# Patient Record
Sex: Female | Born: 1965 | Race: Black or African American | Hispanic: No
Health system: Southern US, Academic
[De-identification: ages and names within clinical notes are randomized; demographics above are authoritative.]

## PROBLEM LIST (undated history)

## (undated) ENCOUNTER — Ambulatory Visit: Payer: BLUE CROSS/BLUE SHIELD

## (undated) ENCOUNTER — Telehealth

## (undated) ENCOUNTER — Ambulatory Visit: Payer: PRIVATE HEALTH INSURANCE

## (undated) ENCOUNTER — Encounter

## (undated) ENCOUNTER — Other Ambulatory Visit

## (undated) ENCOUNTER — Encounter: Attending: Family | Primary: Family

## (undated) ENCOUNTER — Ambulatory Visit

## (undated) ENCOUNTER — Encounter
Attending: Student in an Organized Health Care Education/Training Program | Primary: Student in an Organized Health Care Education/Training Program

## (undated) ENCOUNTER — Ambulatory Visit
Attending: Student in an Organized Health Care Education/Training Program | Primary: Student in an Organized Health Care Education/Training Program

## (undated) ENCOUNTER — Ambulatory Visit: Payer: BLUE CROSS/BLUE SHIELD | Attending: Family | Primary: Family

## (undated) ENCOUNTER — Telehealth: Attending: Surgical Oncology | Primary: Surgical Oncology

## (undated) ENCOUNTER — Encounter: Attending: Infectious Disease | Primary: Infectious Disease

## (undated) DIAGNOSIS — E119 Type 2 diabetes mellitus without complications: Secondary | ICD-10-CM

## (undated) DIAGNOSIS — E785 Hyperlipidemia, unspecified: Secondary | ICD-10-CM

## (undated) DIAGNOSIS — B20 Human immunodeficiency virus [HIV] disease: Secondary | ICD-10-CM

## (undated) DIAGNOSIS — I10 Essential (primary) hypertension: Secondary | ICD-10-CM

## (undated) DIAGNOSIS — Z21 Asymptomatic human immunodeficiency virus [HIV] infection status: Secondary | ICD-10-CM

## (undated) HISTORY — DX: Hyperlipidemia, unspecified: E78.5

## (undated) HISTORY — PX: TONSILLECTOMY: SUR1361

## (undated) HISTORY — PX: PARTIAL HYSTERECTOMY: SHX80

---

## 2004-10-27 ENCOUNTER — Inpatient Hospital Stay: Payer: Self-pay | Admitting: Obstetrics and Gynecology

## 2004-11-22 ENCOUNTER — Inpatient Hospital Stay: Payer: Self-pay | Admitting: Obstetrics and Gynecology

## 2006-02-14 ENCOUNTER — Ambulatory Visit: Payer: Self-pay | Admitting: Obstetrics and Gynecology

## 2008-02-26 ENCOUNTER — Ambulatory Visit: Payer: Self-pay | Admitting: Obstetrics and Gynecology

## 2009-08-10 ENCOUNTER — Emergency Department: Payer: Self-pay | Admitting: Emergency Medicine

## 2009-09-03 ENCOUNTER — Emergency Department: Payer: Self-pay | Admitting: Emergency Medicine

## 2009-09-29 ENCOUNTER — Inpatient Hospital Stay: Payer: Self-pay | Admitting: Internal Medicine

## 2011-05-04 ENCOUNTER — Other Ambulatory Visit: Payer: Self-pay | Admitting: Internal Medicine

## 2011-06-23 ENCOUNTER — Ambulatory Visit: Payer: Self-pay | Admitting: Specialist

## 2011-12-16 DIAGNOSIS — I10 Essential (primary) hypertension: Secondary | ICD-10-CM | POA: Insufficient documentation

## 2012-03-16 DIAGNOSIS — R059 Cough, unspecified: Secondary | ICD-10-CM | POA: Insufficient documentation

## 2012-03-16 DIAGNOSIS — E7800 Pure hypercholesterolemia, unspecified: Secondary | ICD-10-CM | POA: Insufficient documentation

## 2012-06-27 ENCOUNTER — Ambulatory Visit: Payer: Self-pay | Admitting: Specialist

## 2012-12-06 ENCOUNTER — Emergency Department: Payer: Self-pay | Admitting: Emergency Medicine

## 2012-12-06 LAB — URINALYSIS, COMPLETE
Bilirubin,UR: NEGATIVE
Blood: NEGATIVE
Glucose,UR: NEGATIVE mg/dL (ref 0–75)
Ketone: NEGATIVE
Leukocyte Esterase: NEGATIVE
Nitrite: NEGATIVE
Ph: 6 (ref 4.5–8.0)
Protein: NEGATIVE
RBC,UR: NONE SEEN /HPF (ref 0–5)
Specific Gravity: 1.003 (ref 1.003–1.030)
Squamous Epithelial: <1
WBC UR: 1 /HPF (ref 0–5)

## 2012-12-06 LAB — CBC
HCT: 36.8 % (ref 35.0–47.0)
HGB: 12.2 g/dL (ref 12.0–16.0)
MCH: 30.3 pg (ref 26.0–34.0)
MCHC: 33.1 g/dL (ref 32.0–36.0)
MCV: 92 fL (ref 80–100)
Platelet: 271 10*3/uL (ref 150–440)
RBC: 4.02 10*6/uL (ref 3.80–5.20)
RDW: 14.4 % (ref 11.5–14.5)
WBC: 6.1 10*3/uL (ref 3.6–11.0)

## 2012-12-06 LAB — COMPREHENSIVE METABOLIC PANEL
Albumin: 3.6 g/dL (ref 3.4–5.0)
Alkaline Phosphatase: 71 U/L (ref 50–136)
Anion Gap: 5 — ABNORMAL LOW (ref 7–16)
BUN: 10 mg/dL (ref 7–18)
Bilirubin,Total: 0.2 mg/dL (ref 0.2–1.0)
Calcium, Total: 8.7 mg/dL (ref 8.5–10.1)
Chloride: 103 mmol/L (ref 98–107)
Co2: 26 mmol/L (ref 21–32)
Creatinine: 0.68 mg/dL (ref 0.60–1.30)
EGFR (African American): >60
EGFR (Non-African Amer.): >60
Glucose: 123 mg/dL — ABNORMAL HIGH (ref 65–99)
Osmolality: 269 (ref 275–301)
Potassium: 3.1 mmol/L — ABNORMAL LOW (ref 3.5–5.1)
SGOT(AST): 19 U/L (ref 15–37)
SGPT (ALT): 15 U/L (ref 12–78)
Sodium: 134 mmol/L — ABNORMAL LOW (ref 136–145)
Total Protein: 7.8 g/dL (ref 6.4–8.2)

## 2012-12-06 LAB — TSH: Thyroid Stimulating Horm: 1.75 u[IU]/mL

## 2012-12-06 LAB — TROPONIN I: Troponin-I: <0.02 ng/mL

## 2014-10-07 DIAGNOSIS — E010 Iodine-deficiency related diffuse (endemic) goiter: Secondary | ICD-10-CM | POA: Insufficient documentation

## 2014-10-10 ENCOUNTER — Ambulatory Visit: Payer: Self-pay

## 2014-12-05 ENCOUNTER — Other Ambulatory Visit: Payer: Self-pay | Admitting: Family Medicine

## 2014-12-05 DIAGNOSIS — Z1231 Encounter for screening mammogram for malignant neoplasm of breast: Secondary | ICD-10-CM

## 2015-01-01 ENCOUNTER — Ambulatory Visit: Payer: Self-pay | Attending: Family Medicine

## 2015-10-23 DIAGNOSIS — B20 Human immunodeficiency virus [HIV] disease: Secondary | ICD-10-CM | POA: Insufficient documentation

## 2015-10-23 DIAGNOSIS — Z21 Asymptomatic human immunodeficiency virus [HIV] infection status: Secondary | ICD-10-CM | POA: Insufficient documentation

## 2016-04-15 ENCOUNTER — Other Ambulatory Visit: Payer: Self-pay | Admitting: Family Medicine

## 2016-04-15 DIAGNOSIS — Z1231 Encounter for screening mammogram for malignant neoplasm of breast: Secondary | ICD-10-CM

## 2016-04-26 ENCOUNTER — Ambulatory Visit: Payer: Self-pay | Attending: Family Medicine

## 2017-04-20 MED ORDER — HYDROCHLOROTHIAZIDE 12.5 MG TABLET
ORAL_TABLET | 3 refills | 0 days
Start: 2017-04-20 — End: 2018-07-11

## 2017-04-21 ENCOUNTER — Other Ambulatory Visit: Payer: Self-pay | Admitting: Family Medicine

## 2017-04-21 DIAGNOSIS — Z1231 Encounter for screening mammogram for malignant neoplasm of breast: Secondary | ICD-10-CM

## 2017-05-04 ENCOUNTER — Encounter: Payer: Self-pay | Admitting: Radiology

## 2017-05-04 ENCOUNTER — Ambulatory Visit
Admission: RE | Admit: 2017-05-04 | Discharge: 2017-05-04 | Disposition: A | Discharge status: Home / Self Care | Payer: Commercial Managed Care - PPO | Source: Ambulatory Visit | Attending: Family Medicine | Admitting: Family Medicine

## 2017-05-04 DIAGNOSIS — Z1231 Encounter for screening mammogram for malignant neoplasm of breast: Secondary | ICD-10-CM

## 2017-05-18 MED ORDER — EFAVIRENZ 600 MG-EMTRICITABINE 200 MG-TENOFOVIR DISOPROX 300 MG TABLET
ORAL_TABLET | ORAL | 99 refills | 0 days
Start: 2017-05-18 — End: 2018-05-18

## 2017-05-19 MED ORDER — EFAVIRENZ 600 MG-EMTRICITABINE 200 MG-TENOFOVIR DISOPROX 300 MG TABLET
ORAL_TABLET | ORAL | 11 refills | 0 days
Start: 2017-05-19 — End: 2018-05-19

## 2017-05-19 MED ORDER — BICTEGRAVIR 50 MG-EMTRICITABINE 200 MG-TENOFOVIR ALAFENAM 25 MG TABLET
ORAL_TABLET | ORAL | 11 refills | 0.00000 days
Start: 2017-05-19 — End: 2017-05-19

## 2017-05-19 MED ORDER — BICTEGRAVIR 50 MG-EMTRICITABINE 200 MG-TENOFOVIR ALAFENAM 25 MG TABLET: 1 | tablet | 11 refills | 0 days

## 2017-05-22 MED FILL — ATRIPLA/600-200-300MG/TABS: ATRIPLA/600-200-300MG/TABS | 30 days supply | Qty: 30 | Fill #0

## 2017-05-23 MED FILL — BIKTARVY/50-200-25MG/TABS: BIKTARVY/50-200-25MG/TABS | 30 days supply | Qty: 30 | Fill #0

## 2017-06-07 MED FILL — HYDROCHLOROTHIAZIDE/12.5MG/TABS: HYDROCHLOROTHIAZIDE/12.5MG/TABS | 90 days supply | Qty: 90 | Fill #0

## 2017-07-19 MED FILL — BIKTARVY/50-200-25MG/TABS: BIKTARVY/50-200-25MG/TABS | 30 days supply | Qty: 30 | Fill #1

## 2017-08-16 MED FILL — BIKTARVY/50-200-25MG/TABS: BIKTARVY/50-200-25MG/TABS | 30 days supply | Qty: 30 | Fill #2

## 2017-08-16 MED FILL — HYDROCHLOROTHIAZIDE/12.5MG/TABS: HYDROCHLOROTHIAZIDE/12.5MG/TABS | 90 days supply | Qty: 90 | Fill #1

## 2017-08-25 DIAGNOSIS — K5909 Other constipation: Secondary | ICD-10-CM | POA: Insufficient documentation

## 2017-09-18 MED FILL — BIKTARVY/50-200-25MG/TABS: BIKTARVY/50-200-25MG/TABS | 30 days supply | Qty: 30 | Fill #3

## 2017-10-16 MED FILL — BIKTARVY/50-200-25MG/TABS: BIKTARVY/50-200-25MG/TABS | 30 days supply | Qty: 30 | Fill #4

## 2017-11-13 MED FILL — BIKTARVY/50-200-25MG/TABS: BIKTARVY/50-200-25MG/TABS | 30 days supply | Qty: 30 | Fill #5

## 2017-12-18 MED FILL — BIKTARVY/50-200-25MG/TABS: BIKTARVY/50-200-25MG/TABS | 30 days supply | Qty: 30 | Fill #6

## 2017-12-18 MED FILL — HYDROCHLOROTHIAZIDE/12.5MG/TABS: HYDROCHLOROTHIAZIDE/12.5MG/TABS | 90 days supply | Qty: 90 | Fill #2

## 2018-01-17 MED FILL — BIKTARVY/50-200-25MG/TABS: BIKTARVY/50-200-25MG/TABS | 30 days supply | Qty: 30 | Fill #7

## 2018-01-22 MED ORDER — BICTEGRAVIR 50 MG-EMTRICITABINE 200 MG-TENOFOVIR ALAFENAM 25 MG TABLET
ORAL_TABLET | ORAL | 11 refills | 0 days
Start: 2018-01-22 — End: 2019-02-08

## 2018-02-12 MED FILL — BIKTARVY/50-200-25MG/TABS: BIKTARVY/50-200-25MG/TABS | 30 days supply | Qty: 30 | Fill #8

## 2018-03-16 MED FILL — BIKTARVY/50-200-25MG/TABS: BIKTARVY/50-200-25MG/TABS | 30 days supply | Qty: 30 | Fill #9

## 2018-04-12 MED FILL — HYDROCHLOROTHIAZIDE 12.5 MG TABLET: 90 days supply | Qty: 90 | Fill #0

## 2018-04-12 MED FILL — BIKTARVY 50 MG-200 MG-25 MG TABLET: ORAL | 30 days supply | Qty: 30 | Fill #0

## 2018-04-12 MED FILL — HYDROCHLOROTHIAZIDE 12.5 MG TABLET: 90 days supply | Qty: 90 | Fill #0 | Status: AC

## 2018-04-12 MED FILL — BIKTARVY 50 MG-200 MG-25 MG TABLET: 30 days supply | Qty: 30 | Fill #0 | Status: AC

## 2018-05-15 MED FILL — BIKTARVY 50 MG-200 MG-25 MG TABLET: 30 days supply | Qty: 30 | Fill #1 | Status: AC

## 2018-05-15 MED FILL — BIKTARVY 50 MG-200 MG-25 MG TABLET: ORAL | 30 days supply | Qty: 30 | Fill #1

## 2018-06-12 MED FILL — BIKTARVY 50 MG-200 MG-25 MG TABLET: ORAL | 30 days supply | Qty: 30 | Fill #0

## 2018-06-12 MED FILL — BIKTARVY 50 MG-200 MG-25 MG TABLET: 30 days supply | Qty: 30 | Fill #0 | Status: AC

## 2018-07-12 MED FILL — BIKTARVY 50 MG-200 MG-25 MG TABLET: ORAL | 30 days supply | Qty: 30 | Fill #1

## 2018-07-12 MED FILL — BIKTARVY 50 MG-200 MG-25 MG TABLET: 30 days supply | Qty: 30 | Fill #1 | Status: AC

## 2018-08-08 ENCOUNTER — Other Ambulatory Visit: Payer: Self-pay | Admitting: Internal Medicine

## 2018-08-08 DIAGNOSIS — Z1231 Encounter for screening mammogram for malignant neoplasm of breast: Secondary | ICD-10-CM

## 2018-08-10 MED FILL — BIKTARVY 50 MG-200 MG-25 MG TABLET: ORAL | 30 days supply | Qty: 30 | Fill #2

## 2018-08-10 MED FILL — BIKTARVY 50 MG-200 MG-25 MG TABLET: 30 days supply | Qty: 30 | Fill #2 | Status: AC

## 2018-09-03 ENCOUNTER — Ambulatory Visit
Admission: RE | Admit: 2018-09-03 | Discharge: 2018-09-03 | Disposition: A | Discharge status: Home / Self Care | Payer: Commercial Managed Care - PPO | Source: Ambulatory Visit | Attending: Internal Medicine | Admitting: Internal Medicine

## 2018-09-03 DIAGNOSIS — Z1231 Encounter for screening mammogram for malignant neoplasm of breast: Secondary | ICD-10-CM | POA: Diagnosis present

## 2018-09-03 IMAGING — MG DIGITAL SCREENING BILATERAL MAMMOGRAM WITH TOMO AND CAD
8 series · 8 of 24 positions shown · non-contrast
Comparison: Previous exam(s).

CLINICAL DATA: Screening.

EXAM:
DIGITAL SCREENING BILATERAL MAMMOGRAM WITH TOMO AND CAD

[R MLO synth-2D]
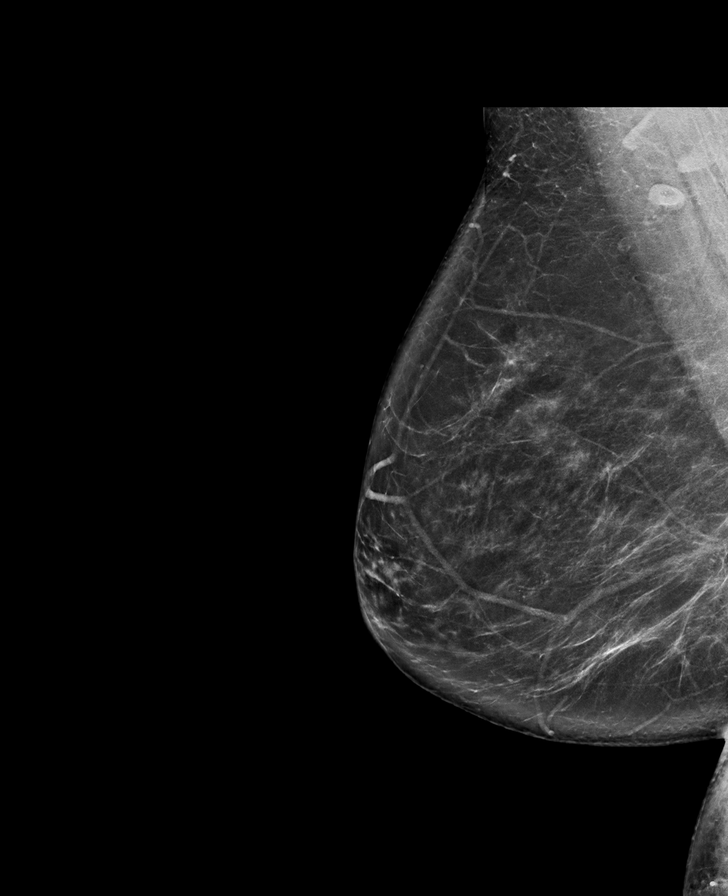

[L CC synth-2D]
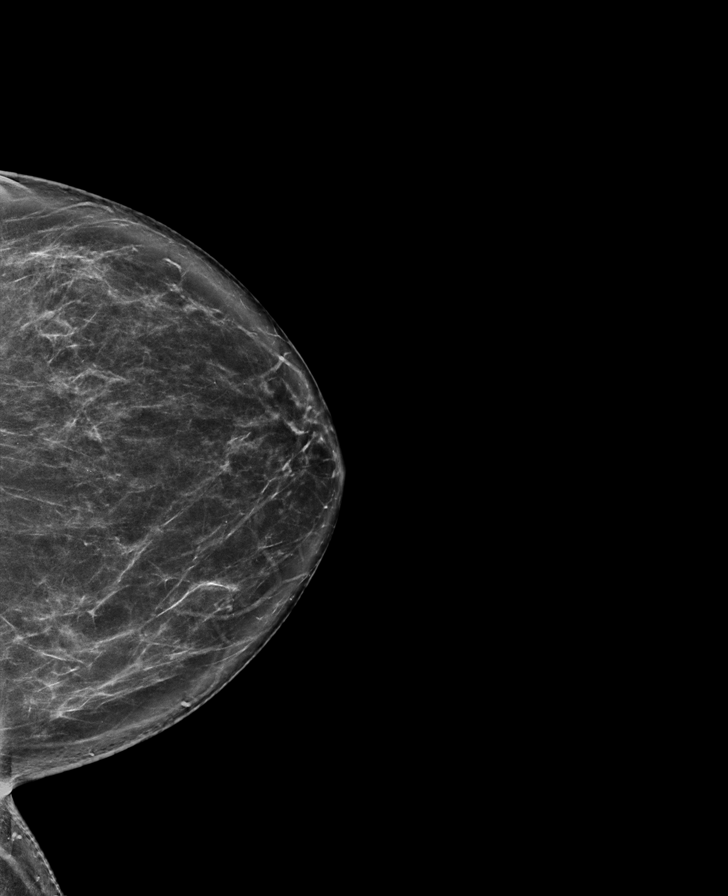

[R CC synth-2D]
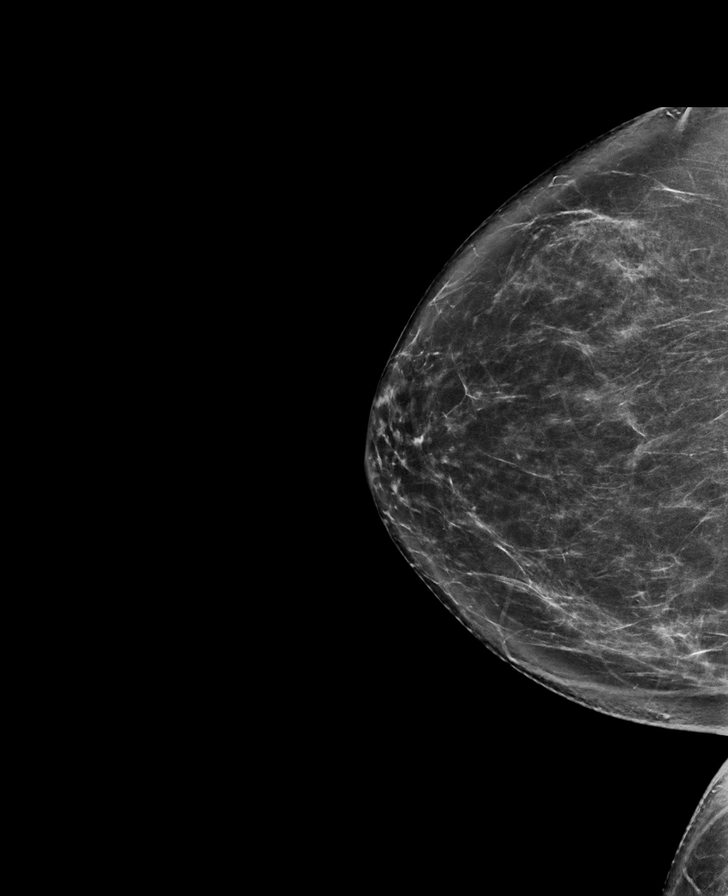

[L MLO synth-2D]
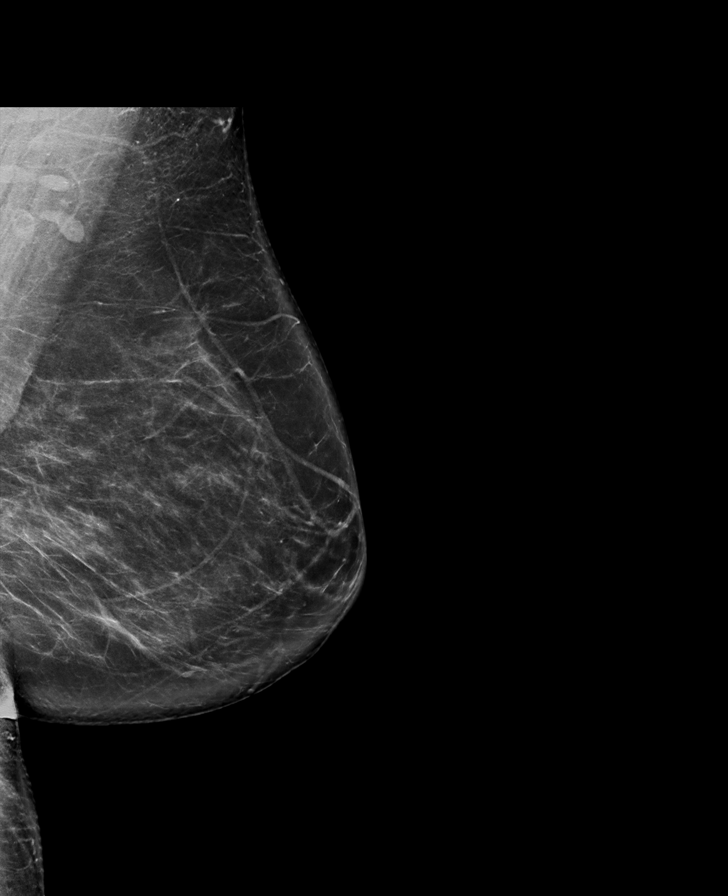

[L MLO tomo · tomo slice 45/90.0]
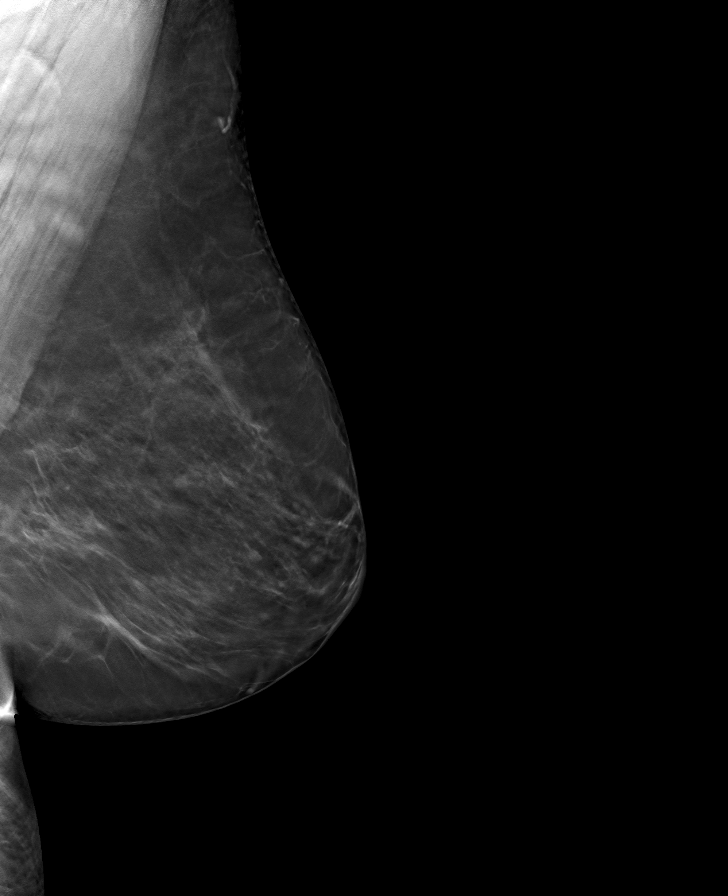

[L CC tomo · tomo slice 41/80.0]
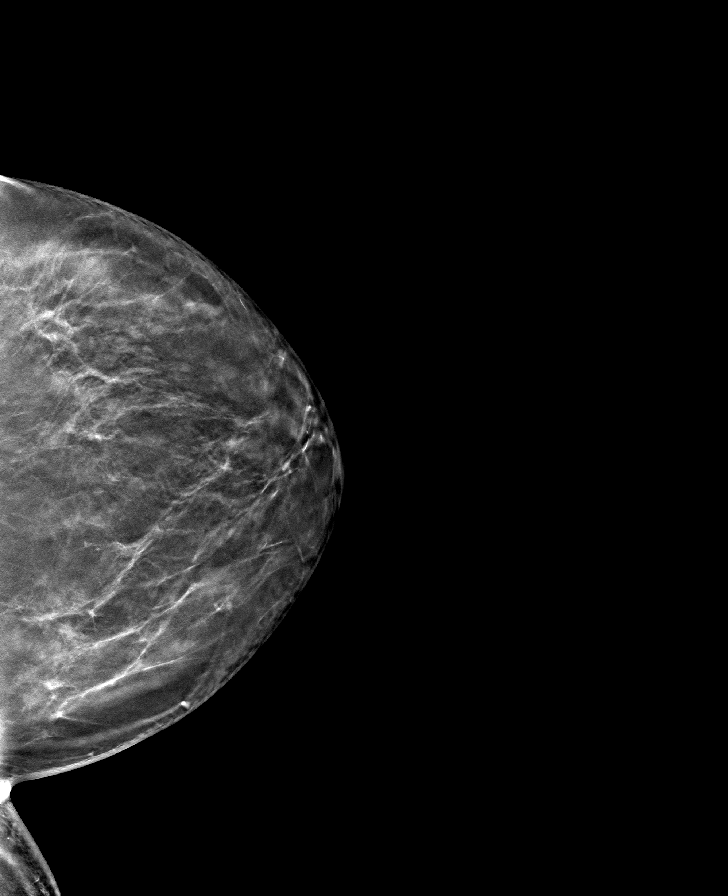

[R CC tomo · tomo slice 41/81.0]
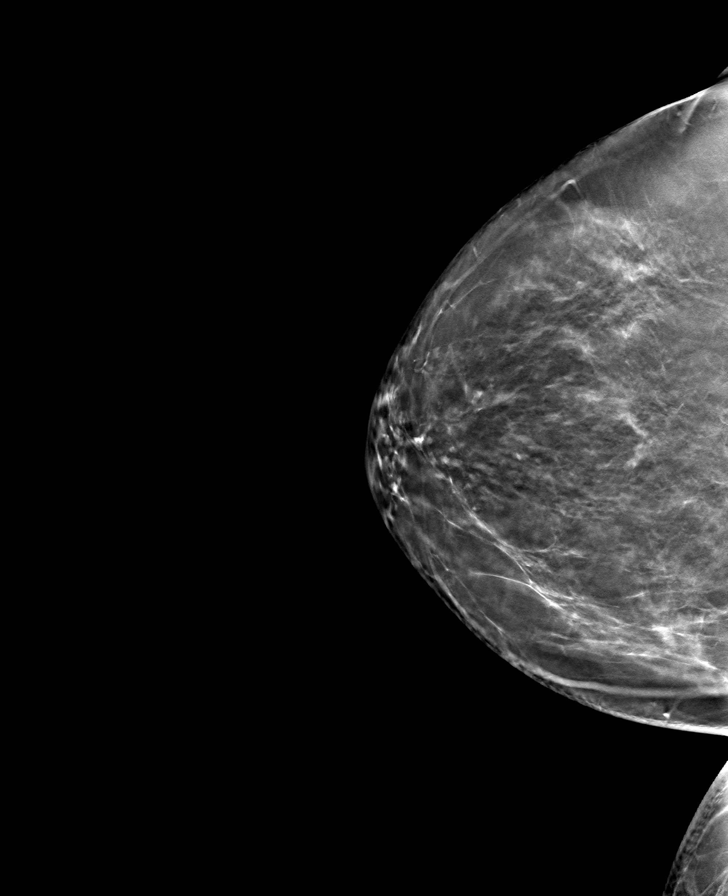

[R MLO tomo · tomo slice 46/91.0]
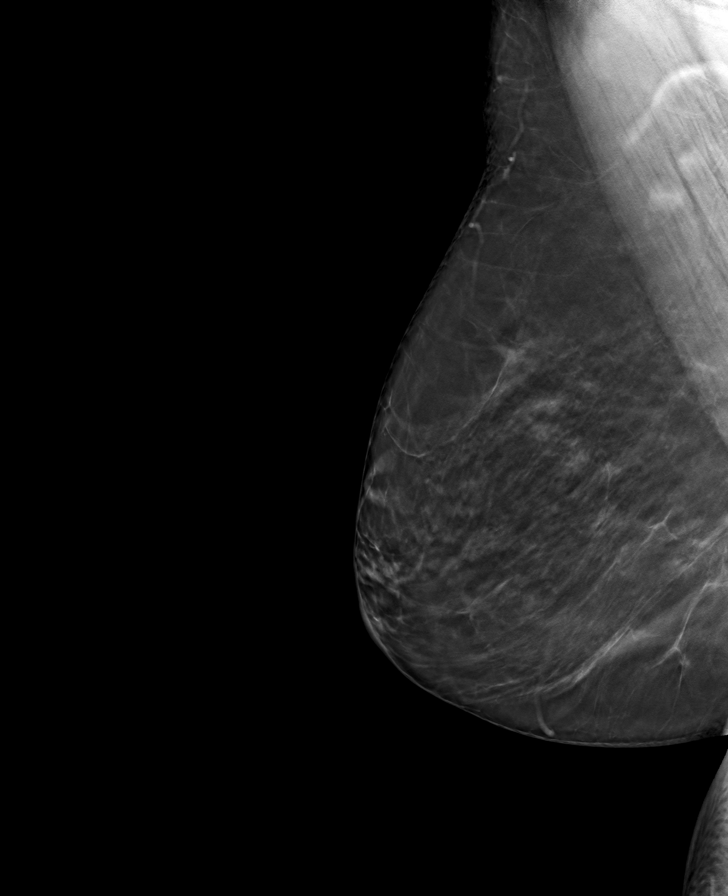

[8 of 24 positions shown; findings below may reference images not displayed]

ACR Breast Density Category b: There are scattered areas of
fibroglandular density.
FINDINGS: There are no findings suspicious for malignancy. Images were
processed with CAD.
IMPRESSION: No mammographic evidence of malignancy. A result letter of this
screening mammogram will be mailed directly to the patient.

RECOMMENDATION:
Screening mammogram in one year. (Code:[TQ])

BI-RADS CATEGORY  1: Negative.

## 2018-09-12 MED FILL — BIKTARVY 50 MG-200 MG-25 MG TABLET: ORAL | 30 days supply | Qty: 30 | Fill #3

## 2018-09-12 MED FILL — BIKTARVY 50 MG-200 MG-25 MG TABLET: 30 days supply | Qty: 30 | Fill #3 | Status: AC

## 2018-10-11 MED FILL — BIKTARVY 50 MG-200 MG-25 MG TABLET: 30 days supply | Qty: 30 | Fill #4 | Status: AC

## 2018-10-11 MED FILL — BIKTARVY 50 MG-200 MG-25 MG TABLET: ORAL | 30 days supply | Qty: 30 | Fill #4

## 2018-10-17 ENCOUNTER — Other Ambulatory Visit: Payer: Self-pay

## 2018-10-17 DIAGNOSIS — Z1211 Encounter for screening for malignant neoplasm of colon: Secondary | ICD-10-CM

## 2018-10-17 MED ORDER — NA SULFATE-K SULFATE-MG SULF 17.5-3.13-1.6 GM/177ML PO SOLN: 1.0000 | Freq: Once | ORAL | 0 refills | Status: AC

## 2018-10-18 ENCOUNTER — Other Ambulatory Visit: Payer: Self-pay

## 2018-10-18 ENCOUNTER — Encounter: Payer: Self-pay | Admitting: *Deleted

## 2018-10-19 ENCOUNTER — Encounter: Payer: Self-pay | Admitting: Anesthesiology

## 2018-10-26 NOTE — Anesthesia Preprocedure Evaluation (Deleted)
Anesthesia Evaluation    Airway        Dental   Pulmonary former smoker (quit 1995),           Cardiovascular hypertension,      Neuro/Psych    GI/Hepatic   Endo/Other    Renal/GU      Musculoskeletal   Abdominal   Peds  Hematology HIV on HAART   Anesthesia Other Findings   Reproductive/Obstetrics                             Anesthesia Physical Anesthesia Plan  ASA: II  Anesthesia Plan: General   Post-op Pain Management:    Induction: Intravenous  PONV Risk Score and Plan: 3 and Propofol infusion and TIVA  Airway Management Planned: Natural Airway  Additional Equipment:   Intra-op Plan:   Post-operative Plan:   Informed Consent:   Plan Discussed with:   Anesthesia Plan Comments:         Anesthesia Quick Evaluation

## 2018-10-26 NOTE — Discharge Instructions (Signed)
General Anesthesia, Adult, Care After  This sheet gives you information about how to care for yourself after your procedure. Your health care provider may also give you more specific instructions. If you have problems or questions, contact your health care provider.  What can I expect after the procedure?  After the procedure, the following side effects are common:  Pain or discomfort at the IV site.  Nausea.  Vomiting.  Sore throat.  Trouble concentrating.  Feeling cold or chills.  Weak or tired.  Sleepiness and fatigue.  Soreness and body aches. These side effects can affect parts of the body that were not involved in surgery.  Follow these instructions at home:    For at least 24 hours after the procedure:  Have a responsible adult stay with you. It is important to have someone help care for you until you are awake and alert.  Rest as needed.  Do not:  Participate in activities in which you could fall or become injured.  Drive.  Use heavy machinery.  Drink alcohol.  Take sleeping pills or medicines that cause drowsiness.  Make important decisions or sign legal documents.  Take care of children on your own.  Eating and drinking  Follow any instructions from your health care provider about eating or drinking restrictions.  When you feel hungry, start by eating small amounts of foods that are soft and easy to digest (bland), such as toast. Gradually return to your regular diet.  Drink enough fluid to keep your urine pale yellow.  If you vomit, rehydrate by drinking water, juice, or clear broth.  General instructions  If you have sleep apnea, surgery and certain medicines can increase your risk for breathing problems. Follow instructions from your health care provider about wearing your sleep device:  Anytime you are sleeping, including during daytime naps.  While taking prescription pain medicines, sleeping medicines, or medicines that make you drowsy.  Return to your normal activities as told by your health care  provider. Ask your health care provider what activities are safe for you.  Take over-the-counter and prescription medicines only as told by your health care provider.  If you smoke, do not smoke without supervision.  Keep all follow-up visits as told by your health care provider. This is important.  Contact a health care provider if:  You have nausea or vomiting that does not get better with medicine.  You cannot eat or drink without vomiting.  You have pain that does not get better with medicine.  You are unable to pass urine.  You develop a skin rash.  You have a fever.  You have redness around your IV site that gets worse.  Get help right away if:  You have difficulty breathing.  You have chest pain.  You have blood in your urine or stool, or you vomit blood.  Summary  After the procedure, it is common to have a sore throat or nausea. It is also common to feel tired.  Have a responsible adult stay with you for the first 24 hours after general anesthesia. It is important to have someone help care for you until you are awake and alert.  When you feel hungry, start by eating small amounts of foods that are soft and easy to digest (bland), such as toast. Gradually return to your regular diet.  Drink enough fluid to keep your urine pale yellow.  Return to your normal activities as told by your health care provider. Ask your health care   provider what activities are safe for you.  This information is not intended to replace advice given to you by your health care provider. Make sure you discuss any questions you have with your health care provider.  Document Released: 11/14/2000 Document Revised: 03/24/2017 Document Reviewed: 03/24/2017  Elsevier Interactive Patient Education  2019 Elsevier Inc[employer]

## 2018-11-07 MED FILL — BIKTARVY 50 MG-200 MG-25 MG TABLET: ORAL | 30 days supply | Qty: 30 | Fill #5

## 2018-11-07 MED FILL — BIKTARVY 50 MG-200 MG-25 MG TABLET: 30 days supply | Qty: 30 | Fill #5 | Status: AC

## 2018-12-10 ENCOUNTER — Ambulatory Visit
Admission: RE | Admit: 2018-12-10 | Payer: Commercial Managed Care - PPO | Source: Ambulatory Visit | Admitting: Gastroenterology

## 2018-12-10 HISTORY — DX: Human immunodeficiency virus (HIV) disease: B20

## 2018-12-10 HISTORY — DX: Essential (primary) hypertension: I10

## 2018-12-10 HISTORY — DX: Asymptomatic human immunodeficiency virus (hiv) infection status: Z21

## 2018-12-10 SURGERY — COLONOSCOPY WITH PROPOFOL
Anesthesia: General

## 2018-12-12 MED FILL — BIKTARVY 50 MG-200 MG-25 MG TABLET: 30 days supply | Qty: 30 | Fill #6 | Status: AC

## 2018-12-12 MED FILL — BIKTARVY 50 MG-200 MG-25 MG TABLET: ORAL | 30 days supply | Qty: 30 | Fill #6

## 2019-01-08 MED FILL — BIKTARVY 50 MG-200 MG-25 MG TABLET: 30 days supply | Qty: 30 | Fill #7 | Status: AC

## 2019-01-08 MED FILL — BIKTARVY 50 MG-200 MG-25 MG TABLET: ORAL | 30 days supply | Qty: 30 | Fill #7

## 2019-02-07 MED ORDER — BIKTARVY 50 MG-200 MG-25 MG TABLET
ORAL_TABLET | Freq: Every day | ORAL | 3 refills | 30.00000 days
Start: 2019-02-07 — End: ?

## 2019-02-11 MED FILL — BIKTARVY 50 MG-200 MG-25 MG TABLET: ORAL | 30 days supply | Qty: 30 | Fill #0

## 2019-02-11 MED FILL — BIKTARVY 50 MG-200 MG-25 MG TABLET: 30 days supply | Qty: 30 | Fill #0 | Status: AC

## 2019-03-11 MED FILL — BIKTARVY 50 MG-200 MG-25 MG TABLET: ORAL | 30 days supply | Qty: 30 | Fill #1

## 2019-03-11 MED FILL — BIKTARVY 50 MG-200 MG-25 MG TABLET: 30 days supply | Qty: 30 | Fill #1 | Status: AC

## 2019-04-04 MED ORDER — BIKTARVY 50 MG-200 MG-25 MG TABLET
ORAL_TABLET | Freq: Every day | ORAL | 5 refills | 30 days
Start: 2019-04-04 — End: ?

## 2019-04-10 MED FILL — BIKTARVY 50 MG-200 MG-25 MG TABLET: ORAL | 30 days supply | Qty: 30 | Fill #0

## 2019-04-10 MED FILL — BIKTARVY 50 MG-200 MG-25 MG TABLET: 30 days supply | Qty: 30 | Fill #0 | Status: AC

## 2019-05-09 MED FILL — BIKTARVY 50 MG-200 MG-25 MG TABLET: 30 days supply | Qty: 30 | Fill #1 | Status: AC

## 2019-05-09 MED FILL — BIKTARVY 50 MG-200 MG-25 MG TABLET: ORAL | 30 days supply | Qty: 30 | Fill #1

## 2019-06-07 MED FILL — BIKTARVY 50 MG-200 MG-25 MG TABLET: ORAL | 30 days supply | Qty: 30 | Fill #2

## 2019-06-07 MED FILL — BIKTARVY 50 MG-200 MG-25 MG TABLET: 30 days supply | Qty: 30 | Fill #2 | Status: AC

## 2019-07-10 MED FILL — BIKTARVY 50 MG-200 MG-25 MG TABLET: ORAL | 30 days supply | Qty: 30 | Fill #3

## 2019-07-10 MED FILL — BIKTARVY 50 MG-200 MG-25 MG TABLET: 30 days supply | Qty: 30 | Fill #3 | Status: AC

## 2019-08-08 MED FILL — BIKTARVY 50 MG-200 MG-25 MG TABLET: ORAL | 30 days supply | Qty: 30 | Fill #4

## 2019-08-08 MED FILL — BIKTARVY 50 MG-200 MG-25 MG TABLET: 30 days supply | Qty: 30 | Fill #4 | Status: AC

## 2019-09-10 MED FILL — BIKTARVY 50 MG-200 MG-25 MG TABLET: 30 days supply | Qty: 30 | Fill #5 | Status: AC

## 2019-09-10 MED FILL — BIKTARVY 50 MG-200 MG-25 MG TABLET: ORAL | 30 days supply | Qty: 30 | Fill #5

## 2019-10-15 MED ORDER — BIKTARVY 50 MG-200 MG-25 MG TABLET
ORAL_TABLET | Freq: Every day | ORAL | 5 refills | 30.00000 days
Start: 2019-10-15 — End: ?

## 2019-10-15 MED FILL — BIKTARVY 50 MG-200 MG-25 MG TABLET: ORAL | 30 days supply | Qty: 30 | Fill #2

## 2019-10-15 MED FILL — BIKTARVY 50 MG-200 MG-25 MG TABLET: 30 days supply | Qty: 30 | Fill #2 | Status: AC

## 2019-10-16 MED ORDER — BIKTARVY 50 MG-200 MG-25 MG TABLET: 1 | tablet | Freq: Every day | 5 refills | 30 days

## 2019-11-11 MED FILL — BIKTARVY 50 MG-200 MG-25 MG TABLET: 30 days supply | Qty: 30 | Fill #3 | Status: AC

## 2019-11-11 MED FILL — BIKTARVY 50 MG-200 MG-25 MG TABLET: ORAL | 30 days supply | Qty: 30 | Fill #3

## 2019-11-21 MED ORDER — BIKTARVY 50 MG-200 MG-25 MG TABLET
ORAL_TABLET | Freq: Every day | ORAL | 11 refills | 30 days
Start: 2019-11-21 — End: ?

## 2019-12-11 MED FILL — BIKTARVY 50 MG-200 MG-25 MG TABLET: ORAL | 30 days supply | Qty: 30 | Fill #0

## 2019-12-11 MED FILL — BIKTARVY 50 MG-200 MG-25 MG TABLET: 30 days supply | Qty: 30 | Fill #0 | Status: AC

## 2020-01-08 MED FILL — BIKTARVY 50 MG-200 MG-25 MG TABLET: ORAL | 30 days supply | Qty: 30 | Fill #1

## 2020-01-08 MED FILL — BIKTARVY 50 MG-200 MG-25 MG TABLET: 30 days supply | Qty: 30 | Fill #1 | Status: AC

## 2020-02-11 MED FILL — BIKTARVY 50 MG-200 MG-25 MG TABLET: 30 days supply | Qty: 30 | Fill #2 | Status: AC

## 2020-02-11 MED FILL — BIKTARVY 50 MG-200 MG-25 MG TABLET: ORAL | 30 days supply | Qty: 30 | Fill #2

## 2020-03-10 MED FILL — BIKTARVY 50 MG-200 MG-25 MG TABLET: 30 days supply | Qty: 30 | Fill #3 | Status: AC

## 2020-03-10 MED FILL — BIKTARVY 50 MG-200 MG-25 MG TABLET: ORAL | 30 days supply | Qty: 30 | Fill #3

## 2020-04-13 MED FILL — BIKTARVY 50 MG-200 MG-25 MG TABLET: ORAL | 30 days supply | Qty: 30 | Fill #4

## 2020-04-13 MED FILL — BIKTARVY 50 MG-200 MG-25 MG TABLET: 30 days supply | Qty: 30 | Fill #4 | Status: AC

## 2020-04-24 ENCOUNTER — Other Ambulatory Visit: Payer: Self-pay | Admitting: Registered Nurse

## 2020-04-24 DIAGNOSIS — Z1231 Encounter for screening mammogram for malignant neoplasm of breast: Secondary | ICD-10-CM

## 2020-05-05 ENCOUNTER — Other Ambulatory Visit: Payer: Self-pay

## 2020-05-05 ENCOUNTER — Other Ambulatory Visit: Payer: Self-pay | Admitting: Gastroenterology

## 2020-05-05 ENCOUNTER — Telehealth (INDEPENDENT_AMBULATORY_CARE_PROVIDER_SITE_OTHER): Payer: Self-pay | Admitting: Gastroenterology

## 2020-05-05 DIAGNOSIS — Z1211 Encounter for screening for malignant neoplasm of colon: Secondary | ICD-10-CM

## 2020-05-05 MED ORDER — PEG 3350-KCL-NA BICARB-NACL 420 G PO SOLR: 4000.0000 mL | Freq: Once | ORAL | 0 refills | Status: AC

## 2020-05-05 NOTE — Progress Notes (Signed)
Gastroenterology Pre-Procedure Review  Request Date: Friday 05/29/20 Requesting Physician: Dr. Servando Snare  PATIENT REVIEW QUESTIONS: The patient responded to the following health history questions as indicated:    1. Are you having any GI issues? no 2. Do you have a personal history of Polyps? no 3. Do you have a family history of Colon Cancer or Polyps? no 4. Diabetes Mellitus? no 5. Joint replacements in the past 12 months?no 6. Major health problems in the past 3 months?no 7. Any artificial heart valves, MVP, or defibrillator?no    MEDICATIONS & ALLERGIES:    Patient reports the following regarding taking any anticoagulation/antiplatelet therapy:   Plavix, Coumadin, Eliquis, Xarelto, Lovenox, Pradaxa, Brilinta, or Effient? no Aspirin? no  Patient confirms/reports the following medications:  Current Outpatient Medications  Medication Sig Dispense Refill  . bictegravir-emtricitabine-tenofovir AF (BIKTARVY) 50-200-25 MG TABS tablet Take by mouth daily.    Marland Kitchen CALTRATE 600+D3 SOFT 600-800 MG-UNIT CHEW Chew 1 tablet by mouth daily.    . hydrochlorothiazide (MICROZIDE) 12.5 MG capsule Take 12.5 mg by mouth daily.    . metFORMIN (GLUCOPHAGE) 500 MG tablet Take 500 mg by mouth daily.    . Multiple Vitamin (MULTIVITAMIN ADULT PO) Take by mouth.    . polyethylene glycol-electrolytes (NULYTELY) 420 g solution Take 4,000 mLs by mouth once for 1 dose. 4000 mL 0   No current facility-administered medications for this visit.    Patient confirms/reports the following allergies:  No Known Allergies  No orders of the defined types were placed in this encounter.   AUTHORIZATION INFORMATION Primary Insurance: 1D#: Group #:  Secondary Insurance: 1D#: Group #:  SCHEDULE INFORMATION: Date: Friday 05/29/20 Time: Location:[facility]

## 2020-05-07 ENCOUNTER — Other Ambulatory Visit: Payer: Self-pay

## 2020-05-07 MED ORDER — PEG 3350-KCL-NA BICARB-NACL 420 G PO SOLR: 4000.0000 mL | Freq: Once | ORAL | 0 refills | Status: AC

## 2020-05-11 MED FILL — BIKTARVY 50 MG-200 MG-25 MG TABLET: 30 days supply | Qty: 30 | Fill #5 | Status: AC

## 2020-05-11 MED FILL — BIKTARVY 50 MG-200 MG-25 MG TABLET: ORAL | 30 days supply | Qty: 30 | Fill #5

## 2020-05-20 ENCOUNTER — Encounter: Payer: Self-pay | Admitting: Gastroenterology

## 2020-05-21 NOTE — Discharge Instructions (Signed)
General Anesthesia, Adult, Care After This sheet gives you information about how to care for yourself after your procedure. Your health care provider may also give you more specific instructions. If you have problems or questions, contact your health care provider. What can I expect after the procedure? After the procedure, the following side effects are common:  Pain or discomfort at the IV site.  Nausea.  Vomiting.  Sore throat.  Trouble concentrating.  Feeling cold or chills.  Weak or tired.  Sleepiness and fatigue.  Soreness and body aches. These side effects can affect parts of the body that were not involved in surgery. Follow these instructions at home:  For at least 24 hours after the procedure:  Have a responsible adult stay with you. It is important to have someone help care for you until you are awake and alert.  Rest as needed.  Do not: ? Participate in activities in which you could fall or become injured. ? Drive. ? Use heavy machinery. ? Drink alcohol. ? Take sleeping pills or medicines that cause drowsiness. ? Make important decisions or sign legal documents. ? Take care of children on your own. Eating and drinking  Follow any instructions from your health care provider about eating or drinking restrictions.  When you feel hungry, start by eating small amounts of foods that are soft and easy to digest (bland), such as toast. Gradually return to your regular diet.  Drink enough fluid to keep your urine pale yellow.  If you vomit, rehydrate by drinking water, juice, or clear broth. General instructions  If you have sleep apnea, surgery and certain medicines can increase your risk for breathing problems. Follow instructions from your health care provider about wearing your sleep device: ? Anytime you are sleeping, including during daytime naps. ? While taking prescription pain medicines, sleeping medicines, or medicines that make you drowsy.  Return to  your normal activities as told by your health care provider. Ask your health care provider what activities are safe for you.  Take over-the-counter and prescription medicines only as told by your health care provider.  If you smoke, do not smoke without supervision.  Keep all follow-up visits as told by your health care provider. This is important. Contact a health care provider if:  You have nausea or vomiting that does not get better with medicine.  You cannot eat or drink without vomiting.  You have pain that does not get better with medicine.  You are unable to pass urine.  You develop a skin rash.  You have a fever.  You have redness around your IV site that gets worse. Get help right away if:  You have difficulty breathing.  You have chest pain.  You have blood in your urine or stool, or you vomit blood. Summary  After the procedure, it is common to have a sore throat or nausea. It is also common to feel tired.  Have a responsible adult stay with you for the first 24 hours after general anesthesia. It is important to have someone help care for you until you are awake and alert.  When you feel hungry, start by eating small amounts of foods that are soft and easy to digest (bland), such as toast. Gradually return to your regular diet.  Drink enough fluid to keep your urine pale yellow.  Return to your normal activities as told by your health care provider. Ask your health care provider what activities are safe for you. This information is not   intended to replace advice given to you by your health care provider. Make sure you discuss any questions you have with your health care provider. Document Revised: 08/11/2017 Document Reviewed: 03/24/2017 Elsevier Patient Education  2020 Elsevier Inc.  

## 2020-05-27 ENCOUNTER — Other Ambulatory Visit: Payer: Self-pay

## 2020-05-27 ENCOUNTER — Other Ambulatory Visit
Admission: RE | Admit: 2020-05-27 | Discharge: 2020-05-27 | Disposition: A | Discharge status: Home / Self Care | Payer: Commercial Managed Care - PPO | Source: Ambulatory Visit | Attending: Gastroenterology | Admitting: Gastroenterology

## 2020-05-27 ENCOUNTER — Ambulatory Visit
Admission: RE | Admit: 2020-05-27 | Discharge: 2020-05-27 | Disposition: A | Discharge status: Home / Self Care | Payer: Commercial Managed Care - PPO | Source: Ambulatory Visit | Attending: Registered Nurse | Admitting: Registered Nurse

## 2020-05-27 DIAGNOSIS — Z20822 Contact with and (suspected) exposure to covid-19: Secondary | ICD-10-CM | POA: Insufficient documentation

## 2020-05-27 DIAGNOSIS — Z1231 Encounter for screening mammogram for malignant neoplasm of breast: Secondary | ICD-10-CM | POA: Diagnosis not present

## 2020-05-27 LAB — SARS CORONAVIRUS 2 (TAT 6-24 HRS): SARS Coronavirus 2: NEGATIVE

## 2020-05-27 MED ORDER — NA SULFATE-K SULFATE-MG SULF 17.5-3.13-1.6 GM/177ML PO SOLN: ORAL | 0 refills | Status: DC

## 2020-05-27 MED ORDER — CLENPIQ 10-3.5-12 MG-GM -GM/160ML PO SOLN: ORAL | 0 refills | Status: DC

## 2020-05-29 ENCOUNTER — Other Ambulatory Visit: Payer: Self-pay

## 2020-05-29 ENCOUNTER — Encounter
Admission: RE | Disposition: A | Discharge status: Home / Self Care | Payer: Self-pay | Source: Home / Self Care | Attending: Gastroenterology

## 2020-05-29 ENCOUNTER — Encounter: Payer: Self-pay | Admitting: Gastroenterology

## 2020-05-29 ENCOUNTER — Ambulatory Visit: Payer: Commercial Managed Care - PPO | Admitting: Anesthesiology

## 2020-05-29 ENCOUNTER — Ambulatory Visit
Admission: RE | Admit: 2020-05-29 | Discharge: 2020-05-29 | Disposition: A | Discharge status: Home / Self Care | Payer: Commercial Managed Care - PPO | Attending: Gastroenterology | Admitting: Gastroenterology

## 2020-05-29 DIAGNOSIS — Z21 Asymptomatic human immunodeficiency virus [HIV] infection status: Secondary | ICD-10-CM | POA: Diagnosis not present

## 2020-05-29 DIAGNOSIS — E119 Type 2 diabetes mellitus without complications: Secondary | ICD-10-CM | POA: Insufficient documentation

## 2020-05-29 DIAGNOSIS — Z1211 Encounter for screening for malignant neoplasm of colon: Secondary | ICD-10-CM | POA: Diagnosis not present

## 2020-05-29 DIAGNOSIS — Z87891 Personal history of nicotine dependence: Secondary | ICD-10-CM | POA: Insufficient documentation

## 2020-05-29 DIAGNOSIS — Z7984 Long term (current) use of oral hypoglycemic drugs: Secondary | ICD-10-CM | POA: Diagnosis not present

## 2020-05-29 DIAGNOSIS — I10 Essential (primary) hypertension: Secondary | ICD-10-CM | POA: Diagnosis not present

## 2020-05-29 DIAGNOSIS — Z79899 Other long term (current) drug therapy: Secondary | ICD-10-CM | POA: Insufficient documentation

## 2020-05-29 HISTORY — DX: Type 2 diabetes mellitus without complications: E11.9

## 2020-05-29 HISTORY — PX: COLONOSCOPY WITH PROPOFOL: SHX5780

## 2020-05-29 LAB — GLUCOSE, CAPILLARY: Glucose-Capillary: 111 mg/dL — ABNORMAL HIGH (ref 70–99)

## 2020-05-29 SURGERY — COLONOSCOPY WITH PROPOFOL
Anesthesia: General | Site: Rectum

## 2020-05-29 MED ORDER — SODIUM CHLORIDE 0.9 % IV SOLN: INTRAVENOUS | Status: DC

## 2020-05-29 MED ORDER — PROPOFOL 10 MG/ML IV BOLUS
INTRAVENOUS | Status: DC | PRN
  Administered 2020-05-29: 70 mg via INTRAVENOUS
  Administered 2020-05-29: 100 mg via INTRAVENOUS
  Administered 2020-05-29: 20 mg via INTRAVENOUS
  Administered 2020-05-29: 50 mg via INTRAVENOUS

## 2020-05-29 MED ORDER — LACTATED RINGERS IV SOLN: INTRAVENOUS | Status: DC

## 2020-05-29 MED ORDER — GLYCOPYRROLATE 0.2 MG/ML IJ SOLN
INTRAMUSCULAR | Status: DC | PRN
  Administered 2020-05-29: .2 mg via INTRAVENOUS

## 2020-05-29 MED ORDER — STERILE WATER FOR IRRIGATION IR SOLN
Status: DC | PRN
  Administered 2020-05-29: .05 mL

## 2020-05-29 SURGICAL SUPPLY — 6 items
GOWN CVR UNV OPN BCK APRN NK (MISCELLANEOUS) ×2 IMPLANT
GOWN ISOL THUMB LOOP REG UNIV (MISCELLANEOUS) ×6
KIT PRC NS LF DISP ENDO (KITS) ×1 IMPLANT
KIT PROCEDURE OLYMPUS (KITS) ×3
MANIFOLD NEPTUNE II (INSTRUMENTS) ×3 IMPLANT
WATER STERILE IRR 250ML POUR (IV SOLUTION) ×3 IMPLANT

## 2020-05-29 NOTE — Transfer of Care (Signed)
Immediate Anesthesia Transfer of Care Note  Patient: Julie Nolan  Procedure(s) Performed: COLONOSCOPY WITH PROPOFOL (N/A Rectum)  Patient Location: PACU  Anesthesia Type: General  Level of Consciousness: awake, alert  and patient cooperative  Airway and Oxygen Therapy: Patient Spontanous Breathing and Patient connected to supplemental oxygen  Post-op Assessment: Post-op Vital signs reviewed, Patient's Cardiovascular Status Stable, Respiratory Function Stable, Patent Airway and No signs of Nausea or vomiting  Post-op Vital Signs: Reviewed and stable  Complications: No complications documented.

## 2020-05-29 NOTE — H&P (Signed)
Midge Minium, MD [facility] [address], [address] [place], [place] [address] Phone: [phone] Fax : [phone]  Primary Care Physician:  [facility] Primary Gastroenterologist:  Dr. Servando Snare  Pre-Procedure History & Physical: HPI:  Julie Nolan is a 54 y.o. female is here for a screening colonoscopy.   Past Medical History:  Diagnosis Date  . Diabetes mellitus without complication (HCC)   . HIV (human immunodeficiency virus infection) (HCC)   . Hypertension     Past Surgical History:  Procedure Laterality Date  . PARTIAL HYSTERECTOMY    . TONSILLECTOMY      Prior to Admission medications   Medication Sig Start Date End Date Taking? Authorizing Provider  bictegravir-emtricitabine-tenofovir AF (BIKTARVY) 50-200-25 MG TABS tablet Take by mouth daily.   Yes [provider]  hydrochlorothiazide (MICROZIDE) 12.5 MG capsule Take 12.5 mg by mouth daily.   Yes [provider]  metFORMIN (GLUCOPHAGE) 500 MG tablet Take 500 mg by mouth daily. 04/28/20  Yes [provider]  Na Sulfate-K Sulfate-Mg Sulf 17.5-3.13-1.6 GM/177ML SOLN At 5 PM the day before procedure take 1 bottle and 5 hours before procedure take 1 bottle. 05/27/20  Yes Pasty Spillers, MD  Sod Picosulfate-Mag Ox-Cit Acd (CLENPIQ) 10-3.5-12 MG-GM -GM/160ML SOLN Take 1 bottle at 5 PM followed by five 8 oz cups of water and repeat 5 hours before procedure. 05/27/20  Yes Tahiliani, Dolphus Jenny, MD  CALTRATE 600+D3 SOFT 600-800 MG-UNIT CHEW Chew 1 tablet by mouth daily. 04/23/20   [provider]  Multiple Vitamin (MULTIVITAMIN ADULT PO) Take by mouth. Patient not taking: Reported on 05/20/2020    [provider]    Allergies as of 05/05/2020  . (No Known Allergies)    Family History  Problem Relation Age of Onset  . Breast cancer Neg Hx     Social History   Socioeconomic History  . Marital status: Married    Spouse name: Not on file  . Number of children:  Not on file  . Years of education: Not on file  . Highest education level: Not on file  Occupational History  . Not on file  Tobacco Use  . Smoking status: Former Smoker    Quit date: 1995    Years since quitting: 26.7  . Smokeless tobacco: Never Used  Vaping Use  . Vaping Use: Never used  Substance and Sexual Activity  . Alcohol use: Yes    Alcohol/week: 1.0 - 2.0 standard drink    Types: 1 - 2 Cans of beer per week  . Drug use: Not on file  . Sexual activity: Not on file  Other Topics Concern  . Not on file  Social History Narrative  . Not on file   Social Determinants of Health   Financial Resource Strain:   . Difficulty of Paying Living Expenses: Not on file  Food Insecurity:   . Worried About Programme researcher, broadcasting/film/video in the Last Year: Not on file  . Ran Out of Food in the Last Year: Not on file  Transportation Needs:   . Lack of Transportation (Medical): Not on file  . Lack of Transportation (Non-Medical): Not on file  Physical Activity:   . Days of Exercise per Week: Not on file  . Minutes of Exercise per Session: Not on file  Stress:   . Feeling of Stress : Not on file  Social Connections:   . Frequency of Communication with Friends and Family: Not on file  .  Frequency of Social Gatherings with Friends and Family: Not on file  . Attends Religious Services: Not on file  . Active Member of Clubs or Organizations: Not on file  . Attends Banker Meetings: Not on file  . Marital Status: Not on file  Intimate Partner Violence:   . Fear of Current or Ex-Partner: Not on file  . Emotionally Abused: Not on file  . Physically Abused: Not on file  . Sexually Abused: Not on file    Review of Systems: See HPI, otherwise negative ROS  Physical Exam: BP (!) 130/95   Pulse 60   Temp 97.6 F (36.4 C) (Temporal)   Resp 16   Ht 5\' 3"  (1.6 m)   Wt 77.1 kg   SpO2 98%   BMI 30.11 kg/m  General:   Alert,  pleasant and cooperative in NAD Head:  Normocephalic  and atraumatic. Neck:  Supple; no masses or thyromegaly. Lungs:  Clear throughout to auscultation.    Heart:  Regular rate and rhythm. Abdomen:  Soft, nontender and nondistended. Normal bowel sounds, without guarding, and without rebound.   Neurologic:  Alert and  oriented x4;  grossly normal neurologically.  Impression/Plan: Julie Nolan is now here to undergo a screening colonoscopy.  Risks, benefits, and alternatives regarding colonoscopy have been reviewed with the patient.  Questions have been answered.  All parties agreeable.

## 2020-05-29 NOTE — Anesthesia Preprocedure Evaluation (Signed)
Anesthesia Evaluation  Patient identified by MRN, date of birth, ID band Patient awake    History of Anesthesia Complications Negative for: history of anesthetic complications  Airway Mallampati: III  TM Distance: >3 FB Neck ROM: Full    Dental no notable dental hx.    Pulmonary former smoker,    Pulmonary exam normal        Cardiovascular Exercise Tolerance: Good hypertension, Pt. on medications Normal cardiovascular exam     Neuro/Psych negative neurological ROS  negative psych ROS   GI/Hepatic negative GI ROS, Neg liver ROS,   Endo/Other  diabetes  Renal/GU negative Renal ROS     Musculoskeletal   Abdominal   Peds  Hematology  (+) HIV (well controlled),   Anesthesia Other Findings   Reproductive/Obstetrics                             Anesthesia Physical Anesthesia Plan  ASA: III  Anesthesia Plan: General   Post-op Pain Management:    Induction: Intravenous  PONV Risk Score and Plan: 3 and TIVA, Propofol infusion and Treatment may vary due to age or medical condition  Airway Management Planned: Nasal Cannula and Natural Airway  Additional Equipment: None  Intra-op Plan:   Post-operative Plan:   Informed Consent: I have reviewed the patients History and Physical, chart, labs and discussed the procedure including the risks, benefits and alternatives for the proposed anesthesia with the patient or authorized representative who has indicated his/her understanding and acceptance.       Plan Discussed with: CRNA  Anesthesia Plan Comments:         Anesthesia Quick Evaluation

## 2020-05-29 NOTE — Anesthesia Postprocedure Evaluation (Signed)
Anesthesia Post Note  Patient: Julie Nolan  Procedure(s) Performed: COLONOSCOPY WITH PROPOFOL (N/A Rectum)     Patient location during evaluation: PACU Anesthesia Type: General Level of consciousness: awake and alert Pain management: pain level controlled Vital Signs Assessment: post-procedure vital signs reviewed and stable Respiratory status: spontaneous breathing, nonlabored ventilation, respiratory function stable and patient connected to nasal cannula oxygen Cardiovascular status: blood pressure returned to baseline and stable Postop Assessment: no apparent nausea or vomiting Anesthetic complications: no   No complications documented.  Wille Celeste [name]

## 2020-05-29 NOTE — Op Note (Addendum)
[facility] Gastroenterology Patient Name: Julie Nolan Procedure Date: 05/29/2020 9:31 AM MRN: [id number] Account #: [id number] Date of Birth: [birth year] Admit Type: Outpatient Age: 54 Room: [facility] OR ROOM 01 Gender: Female Note Status: Supervisor Override Procedure:             Colonoscopy Indications:           Screening for colorectal malignant neoplasm Providers:             Midge Minium MD, MD Medicines:             Propofol per Anesthesia Complications:         No immediate complications. Procedure:             Pre-Anesthesia Assessment:                        - Prior to the procedure, a History and Physical was                         performed, and patient medications and allergies were                         reviewed. The patient's tolerance of previous                         anesthesia was also reviewed. The risks and benefits                         of the procedure and the sedation options and risks                         were discussed with the patient. All questions were                         answered, and informed consent was obtained. Prior                         Anticoagulants: The patient has taken no previous                         anticoagulant or antiplatelet agents. ASA Grade                         Assessment: II - A patient with mild systemic disease.                         After reviewing the risks and benefits, the patient                         was deemed in satisfactory condition to undergo the                         procedure.                        After obtaining informed consent, the colonoscope was                         passed under direct vision. Throughout the procedure,  the patient's blood pressure, pulse, and oxygen                         saturations were monitored continuously. The was                         introduced through the anus and advanced to the the                         cecum,  identified by appendiceal orifice and ileocecal                         valve. The colonoscopy was performed without                         difficulty. The patient tolerated the procedure well.                         The quality of the bowel preparation was excellent. Findings:      The perianal and digital rectal examinations were normal.      The entire examined colon appeared normal. Impression:            - The entire examined colon is normal.                        - No specimens collected. Recommendation:        - Discharge patient to home.                        - Resume previous diet.                        - Continue present medications.                        - Repeat colonoscopy in 10 years for screening                         purposes.                        - unless any change in family history or lower GI                         problems. Procedure Code(s):     --- Professional ---                        [phone], Colonoscopy, flexible; diagnostic, including                         collection of specimen(s) by brushing or washing, when                         performed (separate procedure) Diagnosis Code(s):     --- Professional ---                        Z12.11, Encounter for screening for malignant neoplasm  of colon CPT copyright 2019 American [employer]. All rights reserved. The codes documented in this report are preliminary and upon coder review may  be revised to meet current compliance requirements. Midge Minium MD, MD 05/29/2020 9:52:57 AM This report has been signed electronically. Number of Addenda: 0 Note Initiated On: 05/29/2020 9:31 AM Scope Withdrawal Time: 0 hours 7 minutes 50 seconds  Total Procedure Duration: 0 hours 12 minutes 56 seconds  Estimated Blood Loss:  Estimated blood loss: none.      [facility]

## 2020-05-29 NOTE — Anesthesia Procedure Notes (Signed)
Procedure Name: General with mask airway Date/Time: 05/29/2020 9:44 AM Performed by: Jinny Blossom, CRNA Pre-anesthesia Checklist: Timeout performed, Patient being monitored, Suction available, Emergency Drugs available and Patient identified Patient Re-evaluated:Patient Re-evaluated prior to induction Oxygen Delivery Method: Nasal cannula

## 2020-06-01 ENCOUNTER — Encounter: Payer: Self-pay | Admitting: Gastroenterology

## 2020-06-10 MED FILL — BIKTARVY 50 MG-200 MG-25 MG TABLET: ORAL | 30 days supply | Qty: 30 | Fill #0

## 2020-06-10 MED FILL — BIKTARVY 50 MG-200 MG-25 MG TABLET: 30 days supply | Qty: 30 | Fill #0 | Status: AC

## 2020-07-07 MED FILL — BIKTARVY 50 MG-200 MG-25 MG TABLET: 30 days supply | Qty: 30 | Fill #1 | Status: AC

## 2020-07-07 MED FILL — BIKTARVY 50 MG-200 MG-25 MG TABLET: ORAL | 30 days supply | Qty: 30 | Fill #1

## 2020-08-07 MED FILL — BIKTARVY 50 MG-200 MG-25 MG TABLET: 30 days supply | Qty: 30 | Fill #2 | Status: AC

## 2020-08-07 MED FILL — BIKTARVY 50 MG-200 MG-25 MG TABLET: ORAL | 30 days supply | Qty: 30 | Fill #2

## 2020-09-10 MED FILL — BIKTARVY 50 MG-200 MG-25 MG TABLET: ORAL | 30 days supply | Qty: 30 | Fill #3

## 2020-09-21 ENCOUNTER — Other Ambulatory Visit: Payer: Self-pay | Admitting: Registered Nurse

## 2020-09-21 DIAGNOSIS — M8580 Other specified disorders of bone density and structure, unspecified site: Secondary | ICD-10-CM

## 2020-09-21 DIAGNOSIS — B20 Human immunodeficiency virus [HIV] disease: Secondary | ICD-10-CM

## 2020-09-21 DIAGNOSIS — Z21 Asymptomatic human immunodeficiency virus [HIV] infection status: Secondary | ICD-10-CM

## 2020-10-01 MED ORDER — ROSUVASTATIN 10 MG TABLET
ORAL_TABLET | 2 refills | 0 days
Start: 2020-10-01 — End: ?

## 2020-10-02 MED FILL — ROSUVASTATIN 10 MG TABLET: 90 days supply | Qty: 90 | Fill #0

## 2020-10-02 MED FILL — BIKTARVY 50 MG-200 MG-25 MG TABLET: ORAL | 30 days supply | Qty: 30 | Fill #4

## 2020-10-08 ENCOUNTER — Encounter: Payer: Self-pay | Admitting: Cardiology

## 2020-10-20 ENCOUNTER — Encounter: Payer: Self-pay | Admitting: Cardiology

## 2020-10-20 ENCOUNTER — Other Ambulatory Visit: Payer: Self-pay

## 2020-10-20 ENCOUNTER — Ambulatory Visit (INDEPENDENT_AMBULATORY_CARE_PROVIDER_SITE_OTHER): Payer: Commercial Managed Care - PPO | Admitting: Cardiology

## 2020-10-20 VITALS — BP 132/80 | HR 59 | Ht 63.0 in | Wt 172.2 lb

## 2020-10-20 DIAGNOSIS — E7800 Pure hypercholesterolemia, unspecified: Secondary | ICD-10-CM

## 2020-10-20 DIAGNOSIS — I10 Essential (primary) hypertension: Secondary | ICD-10-CM | POA: Diagnosis not present

## 2020-10-20 MED ORDER — ROSUVASTATIN CALCIUM 20 MG PO TABS: 20.0000 mg | ORAL_TABLET | Freq: Every day | ORAL | 3 refills | Status: DC

## 2020-10-20 NOTE — Progress Notes (Signed)
Cardiology Office Note:    Date:  10/20/2020   ID:  Julie Nolan, Julie Nolan December 20, 1965, MRN [id number]  PCP:  Center, [facility]   [facility]  Cardiologist:  Debbe Odea, MD  Advanced Practice Provider:  No care team member to display Electrophysiologist:  None       Referring MD: Center, [facility]*   Chief Complaint  Patient presents with  . New Patient (Initial Visit)    Ref by Dr. Regina Eck increased LDL and has familial hyperlipidemia. Medications reviewed by the patient verbally.    Julie Nolan is a 55 y.o. female who is being seen today for the evaluation of elevated LDL at the request of Center, [employer]*.   History of Present Illness:    Julie Nolan is a 55 y.o. female with a hx of hypertension, hyperlipidemia, diabetes, HIV who presents due to elevated LDL.  Patient states having elevated LDL cholesterol, started on Crestor 5 mg daily by PCP, which she has been taking about a month now.  Checked her cholesterol levels roughly 2 weeks ago and LDL still elevated.  She states having a family history of hyperlipidemia with all 3 brothers having elevated cholesterol levels, on medicines.  Diagnosed with HIV in 2008, has been on antiretrovirals since.  Denies any chest pain or shortness of breath, denies history of heart disease.  Past Medical History:  Diagnosis Date  . Diabetes mellitus without complication (HCC)   . HIV (human immunodeficiency virus infection) (HCC)   . Hyperlipidemia   . Hypertension     Past Surgical History:  Procedure Laterality Date  . COLONOSCOPY WITH PROPOFOL N/A 05/29/2020   Procedure: COLONOSCOPY WITH PROPOFOL;  Surgeon: Midge Minium, MD;  Location: [facility];  Service: Endoscopy;  Laterality: N/A;  . PARTIAL HYSTERECTOMY    . TONSILLECTOMY      Current Medications: Current Meds  Medication Sig  . bictegravir-emtricitabine-tenofovir AF (BIKTARVY) 50-200-25 MG TABS tablet Take  by mouth daily.  Marland Kitchen CALTRATE 600+D3 SOFT 600-800 MG-UNIT CHEW Chew 1 tablet by mouth daily.  . hydrochlorothiazide (MICROZIDE) 12.5 MG capsule Take 12.5 mg by mouth daily.  . metFORMIN (GLUCOPHAGE) 500 MG tablet Take 500 mg by mouth daily.  . Multiple Vitamin (MULTIVITAMIN ADULT PO) Take by mouth.  . rosuvastatin (CRESTOR) 20 MG tablet Take 1 tablet (20 mg total) by mouth daily.  . [DISCONTINUED] CRESTOR 10 MG tablet Take 5 mg by mouth daily.     Allergies:   Patient has no known allergies.   Social History   Socioeconomic History  . Marital status: Married    Spouse name: Not on file  . Number of children: Not on file  . Years of education: Not on file  . Highest education level: Not on file  Occupational History  . Not on file  Tobacco Use  . Smoking status: Former Smoker    Quit date: 1995    Years since quitting: 27.1  . Smokeless tobacco: Never Used  Vaping Use  . Vaping Use: Never used  Substance and Sexual Activity  . Alcohol use: Yes    Alcohol/week: 1.0 - 2.0 standard drink    Types: 1 - 2 Cans of beer per week  . Drug use: Not on file  . Sexual activity: Not on file  Other Topics Concern  . Not on file  Social History Narrative  . Not on file   Social Determinants of Health   Financial Resource Strain:  Not on file  Food Insecurity: Not on file  Transportation Needs: Not on file  Physical Activity: Not on file  Stress: Not on file  Social Connections: Not on file     Family History: The patient's family history is negative for Breast cancer.  ROS:   Please see the history of present illness.     All other systems reviewed and are negative.  EKGs/Labs/Other Studies Reviewed:    The following studies were reviewed today:   EKG:  EKG is  ordered today.  The ekg ordered today demonstrates sinus bradycardia, heart rate 59  Recent Labs: No results found for requested labs within last 8760 hours.  Recent Lipid Panel No results found for: CHOL, TRIG,  HDL, CHOLHDL, VLDL, LDLCALC, LDLDIRECT Cholesterol values primary care physician, total cholesterol 224, triglycerides 91, LDL 143, HDL 65, LDL/HDL ratio 2.2  Risk Assessment/Calculations:      Physical Exam:    VS:  BP 132/80 (BP Location: Right Arm, Patient Position: Sitting, Cuff Size: Normal)   Pulse (!) 59   Ht 5\' 3"  (1.6 m)   Wt 172 lb 4 oz (78.1 kg)   SpO2 99%   BMI 30.51 kg/m     Wt Readings from Last 3 Encounters:  10/20/20 172 lb 4 oz (78.1 kg)  05/29/20 170 lb (77.1 kg)     GEN:  Well nourished, well developed in no acute distress HEENT: Normal NECK: No JVD; No carotid bruits LYMPHATICS: No lymphadenopathy CARDIAC: RRR, no murmurs, rubs, gallops RESPIRATORY:  Clear to auscultation without rales, wheezing or rhonchi  ABDOMEN: Soft, non-tender, non-distended MUSCULOSKELETAL:  No edema; No deformity  SKIN: Warm and dry NEUROLOGIC:  Alert and oriented x 3 PSYCHIATRIC:  Normal affect   ASSESSMENT:    1. Pure hypercholesterolemia   2. Primary hypertension    PLAN:    In order of problems listed above:  1. Hyperlipidemia, history of HIV on antiretroviral combo including tenofovir which is known to elevated LDL.  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  10-year ASCVD risk 18.  Increase Crestor to 20 mg daily.   2. Hypertension, BP controlled.  Continue HCTZ.  Follow-up in 3 months after repeat lipid panel.     Medication Adjustments/Labs and Tests Ordered: Current medicines are reviewed at length with the patient today.  Concerns regarding medicines are outlined above.  Orders Placed This Encounter  Procedures  . Lipid panel  . EKG 12-Lead   Meds ordered this encounter  Medications  . rosuvastatin (CRESTOR) 20 MG tablet    Sig: Take 1 tablet (20 mg total) by mouth daily.    Dispense:  30 tablet    Refill:  3    Patient Instructions  Medication Instructions:  Your physician has  recommended you make the following change in your medication:   1.  INCREASE your Crestor to 20 MG once daily.  *If you need a refill on your cardiac medications before your next appointment, please call your pharmacy*   Lab Work:  Your physician recommends that you return for a FASTING lipid profile: in 3 months just prior to your 3 month follow up.  - You will need to be fasting. Please do not have anything to eat or drink after midnight the morning you have the lab work. You may only have water or black coffee with no cream or sugar. - Please go to the [facility]. You will check in  at the front desk to the right as you walk into the atrium. Valet Parking is offered if needed. - No appointment needed. You may go any day between 7 am and 6 pm.  Testing/Procedures:  None ordered   Follow-Up: At [facility], you and your health needs are our priority.  As part of our continuing mission to provide you with exceptional heart care, we have created designated Provider Care Teams.  These Care Teams include your primary Cardiologist (physician) and Advanced Practice Providers (APPs -  Physician Assistants and Nurse Practitioners) who all work together to provide you with the care you need, when you need it.  We recommend signing up for the patient portal called "MyChart".  Sign up information is provided on this After Visit Summary.  MyChart is used to connect with patients for Virtual Visits (Telemedicine).  Patients are able to view lab/test results, encounter notes, upcoming appointments, etc.  Non-urgent messages can be sent to your provider as well.   To learn more about what you can do with MyChart, go to [email or web address].    Your next appointment:   3 month(s)  The format for your next appointment:   In Person  Provider:   Debbe Odea, MD   Other Instructions      Signed, Debbe Odea, MD  10/20/2020 9:38 AM    [facility]

## 2020-10-20 NOTE — Patient Instructions (Signed)
Medication Instructions:  Your physician has recommended you make the following change in your medication:   1.  INCREASE your Crestor to 20 MG once daily.  *If you need a refill on your cardiac medications before your next appointment, please call your pharmacy*   Lab Work:  Your physician recommends that you return for a FASTING lipid profile: in 3 months just prior to your 3 month follow up.  - You will need to be fasting. Please do not have anything to eat or drink after midnight the morning you have the lab work. You may only have water or black coffee with no cream or sugar. - Please go to the [facility]. You will check in at the front desk to the right as you walk into the atrium. Valet Parking is offered if needed. - No appointment needed. You may go any day between 7 am and 6 pm.  Testing/Procedures:  None ordered   Follow-Up: At [facility], you and your health needs are our priority.  As part of our continuing mission to provide you with exceptional heart care, we have created designated Provider Care Teams.  These Care Teams include your primary Cardiologist (physician) and Advanced Practice Providers (APPs -  Physician Assistants and Nurse Practitioners) who all work together to provide you with the care you need, when you need it.  We recommend signing up for the patient portal called "MyChart".  Sign up information is provided on this After Visit Summary.  MyChart is used to connect with patients for Virtual Visits (Telemedicine).  Patients are able to view lab/test results, encounter notes, upcoming appointments, etc.  Non-urgent messages can be sent to your provider as well.   To learn more about what you can do with MyChart, go to [email or web address].    Your next appointment:   3 month(s)  The format for your next appointment:   In Person  Provider:   Debbe Odea, MD   Other Instructions

## 2020-11-05 MED FILL — BIKTARVY 50 MG-200 MG-25 MG TABLET: ORAL | 30 days supply | Qty: 30 | Fill #5

## 2020-11-24 ENCOUNTER — Other Ambulatory Visit: Payer: Self-pay

## 2020-11-24 ENCOUNTER — Ambulatory Visit
Admission: RE | Admit: 2020-11-24 | Discharge: 2020-11-24 | Disposition: A | Discharge status: Home / Self Care | Payer: Commercial Managed Care - PPO | Source: Ambulatory Visit | Attending: Registered Nurse | Admitting: Registered Nurse

## 2020-11-24 DIAGNOSIS — B20 Human immunodeficiency virus [HIV] disease: Secondary | ICD-10-CM | POA: Diagnosis present

## 2020-11-24 DIAGNOSIS — M8580 Other specified disorders of bone density and structure, unspecified site: Secondary | ICD-10-CM | POA: Insufficient documentation

## 2020-11-24 DIAGNOSIS — Z21 Asymptomatic human immunodeficiency virus [HIV] infection status: Secondary | ICD-10-CM

## 2020-12-08 MED ORDER — BIKTARVY 50 MG-200 MG-25 MG TABLET
ORAL_TABLET | Freq: Every day | ORAL | 11 refills | 30 days
Start: 2020-12-08 — End: ?

## 2020-12-09 MED FILL — BIKTARVY 50 MG-200 MG-25 MG TABLET: ORAL | 30 days supply | Qty: 30 | Fill #0

## 2020-12-25 MED ORDER — HYDROCHLOROTHIAZIDE 25 MG TABLET
ORAL_TABLET | Freq: Every day | ORAL | 2 refills | 90.00000 days
Start: 2020-12-25 — End: ?

## 2021-01-07 MED FILL — BIKTARVY 50 MG-200 MG-25 MG TABLET: ORAL | 30 days supply | Qty: 30 | Fill #1

## 2021-01-07 MED FILL — HYDROCHLOROTHIAZIDE 25 MG TABLET: ORAL | 90 days supply | Qty: 90 | Fill #0

## 2021-01-20 ENCOUNTER — Other Ambulatory Visit
Admission: RE | Admit: 2021-01-20 | Discharge: 2021-01-20 | Disposition: A | Discharge status: Home / Self Care | Payer: Commercial Managed Care - PPO | Attending: Cardiology | Admitting: Cardiology

## 2021-01-20 DIAGNOSIS — E7800 Pure hypercholesterolemia, unspecified: Secondary | ICD-10-CM | POA: Diagnosis not present

## 2021-01-20 LAB — LIPID PANEL
Cholesterol: 162 mg/dL (ref 0–200)
HDL: 72 mg/dL (ref >40)
LDL Cholesterol: 66 mg/dL (ref 0–99)
Total CHOL/HDL Ratio: 2.3 ratio
Triglycerides: 122 mg/dL (ref <150)
VLDL: 24 mg/dL (ref 0–40)

## 2021-01-25 ENCOUNTER — Telehealth: Payer: Self-pay

## 2021-01-25 ENCOUNTER — Ambulatory Visit: Payer: Commercial Managed Care - PPO | Admitting: Cardiology

## 2021-01-25 MED ORDER — ROSUVASTATIN 20 MG TABLET
ORAL_TABLET | Freq: Every day | ORAL | 3 refills | 90 days
Start: 2021-01-25 — End: ?

## 2021-01-25 MED ORDER — ROSUVASTATIN CALCIUM 20 MG PO TABS: 20.0000 mg | ORAL_TABLET | Freq: Every day | ORAL | 3 refills | Status: DC

## 2021-01-25 NOTE — Telephone Encounter (Signed)
Margrett Rud, [place]  01/25/2021 11:46 AM EDT Back to Top     Attempted to contact patient with lab results.  No answer. LMOV.  Phone note started   Debbe Odea, MD  01/25/2021 10:10 AM EDT      Cholesterol levels are now well controlled. Continue Crestor as prescribed. Thank you   Jefferey Pica, RN  01/21/2021 8:17 AM EDT      Preliminary results reviewed. Forwarded to MD desktop for review and signature.

## 2021-01-25 NOTE — Telephone Encounter (Signed)
The patient has been notified of the result and verbalized understanding.  All questions (if any) were answered. Margrett Rud, [place] 01/25/2021 3:52 PM    Patient needed a refill on Crestor - Sent in a 90 day supply to patients preferred pharmacy.

## 2021-01-25 NOTE — Addendum Note (Signed)
Addended by: Margrett Rud on: 01/25/2021 03:53 PM   Modules accepted: Orders

## 2021-01-25 NOTE — Telephone Encounter (Signed)
Patient returning call   Please advise when able 

## 2021-02-04 MED FILL — BIKTARVY 50 MG-200 MG-25 MG TABLET: ORAL | 30 days supply | Qty: 30 | Fill #2

## 2021-02-19 ENCOUNTER — Other Ambulatory Visit: Payer: Self-pay

## 2021-02-19 ENCOUNTER — Ambulatory Visit: Payer: Commercial Managed Care - PPO | Admitting: Cardiology

## 2021-02-19 ENCOUNTER — Encounter: Payer: Self-pay | Admitting: Cardiology

## 2021-02-19 VITALS — BP 120/84 | HR 53 | Ht 63.0 in | Wt 161.2 lb

## 2021-02-19 DIAGNOSIS — E7800 Pure hypercholesterolemia, unspecified: Secondary | ICD-10-CM

## 2021-02-19 DIAGNOSIS — I10 Essential (primary) hypertension: Secondary | ICD-10-CM

## 2021-02-19 NOTE — Progress Notes (Signed)
Cardiology Office Note:    Date:  02/19/2021   ID:  Julie Nolan, Julie Nolan 1965-10-07, MRN [id number]  PCP:  Center, [facility]   [facility]  Cardiologist:  Debbe Odea, MD  Advanced Practice Provider:  No care team member to display Electrophysiologist:  None       Referring MD: Center, [facility]*   Chief Complaint  Patient presents with   3 month follow up     "Doing well." Medications reviewed by the patient verbally.      History of Present Illness:    Julie Nolan is a 55 y.o. female with a hx of hypertension, hyperlipidemia, diabetes, HIV who presents for follow-up.    She was last seen due to elevated LDL.  Etiology of her elevated LDL include antiretroviral meds although family history of high cholesterol also exist.  Crestor was increased to 20 mg daily.  Patient is compliant with medications as prescribed.  Denies any adverse effects.  Repeat cholesterol was obtained which showed much improved cholesterol numbers.  She feels well, has no concerns at this time.  Past Medical History:  Diagnosis Date   Diabetes mellitus without complication (HCC)    HIV (human immunodeficiency virus infection) (HCC)    Hyperlipidemia    Hypertension     Past Surgical History:  Procedure Laterality Date   COLONOSCOPY WITH PROPOFOL N/A 05/29/2020   Procedure: COLONOSCOPY WITH PROPOFOL;  Surgeon: Midge Minium, MD;  Location: [facility];  Service: Endoscopy;  Laterality: N/A;   PARTIAL HYSTERECTOMY     TONSILLECTOMY      Current Medications: Current Meds  Medication Sig   bictegravir-emtricitabine-tenofovir AF (BIKTARVY) 50-200-25 MG TABS tablet Take by mouth daily.   CALTRATE 600+D3 SOFT 600-800 MG-UNIT CHEW Chew 1 tablet by mouth daily.   hydrochlorothiazide (MICROZIDE) 12.5 MG capsule Take 12.5 mg by mouth daily.   metFORMIN (GLUCOPHAGE) 500 MG tablet Take 500 mg by mouth daily.   Multiple Vitamin (MULTIVITAMIN ADULT PO)  Take by mouth.   rosuvastatin (CRESTOR) 20 MG tablet Take 1 tablet (20 mg total) by mouth daily.     Allergies:   Patient has no known allergies.   Social History   Socioeconomic History   Marital status: Married    Spouse name: Not on file   Number of children: Not on file   Years of education: Not on file   Highest education level: Not on file  Occupational History   Not on file  Tobacco Use   Smoking status: Former    Pack years: 0.00    Types: Cigarettes    Quit date: 56    Years since quitting: 27.5   Smokeless tobacco: Never  Vaping Use   Vaping Use: Never used  Substance and Sexual Activity   Alcohol use: Yes    Alcohol/week: 1.0 - 2.0 standard drink    Types: 1 - 2 Cans of beer per week   Drug use: Not on file   Sexual activity: Not on file  Other Topics Concern   Not on file  Social History Narrative   Not on file   Social Determinants of Health   Financial Resource Strain: Not on file  Food Insecurity: Not on file  Transportation Needs: Not on file  Physical Activity: Not on file  Stress: Not on file  Social Connections: Not on file     Family History: The patient's family history is negative for Breast cancer.  ROS:  Please see the history of present illness.     All other systems reviewed and are negative.  EKGs/Labs/Other Studies Reviewed:    The following studies were reviewed today:   EKG:  EKG is  ordered today.  The ekg ordered today demonstrates sinus bradycardia, heart rate 53  Recent Labs: No results found for requested labs within last 8760 hours.  Recent Lipid Panel    Component Value Date/Time   CHOL 162 01/20/2021 1702   TRIG 122 01/20/2021 1702   HDL 72 01/20/2021 1702   CHOLHDL 2.3 01/20/2021 1702   VLDL 24 01/20/2021 1702   LDLCALC 66 01/20/2021 1702   Cholesterol values primary care physician, total cholesterol 224, triglycerides 91, LDL 143, HDL 65, LDL/HDL ratio 2.2  Risk Assessment/Calculations:       Physical Exam:    VS:  BP 120/84 (BP Location: Left Arm, Patient Position: Sitting, Cuff Size: Normal)   Pulse (!) 53   Ht 5\' 3"  (1.6 m)   Wt 161 lb 4 oz (73.1 kg)   SpO2 99%   BMI 28.56 kg/m     Wt Readings from Last 3 Encounters:  02/19/21 161 lb 4 oz (73.1 kg)  10/20/20 172 lb 4 oz (78.1 kg)  05/29/20 170 lb (77.1 kg)     GEN:  Well nourished, well developed in no acute distress HEENT: Normal NECK: No JVD; No carotid bruits LYMPHATICS: No lymphadenopathy CARDIAC: RRR, no murmurs, rubs, gallops RESPIRATORY:  Clear to auscultation without rales, wheezing or rhonchi  ABDOMEN: Soft, non-tender, non-distended MUSCULOSKELETAL:  No edema; No deformity  SKIN: Warm and dry NEUROLOGIC:  Alert and oriented x 3 PSYCHIATRIC:  Normal affect   ASSESSMENT:    1. Pure hypercholesterolemia   2. Primary hypertension     PLAN:    In order of problems listed above:  Hyperlipidemia, history of HIV on antiretroviral combo including tenofovir which is known to elevated LDL.  Last cholesterol levels control of diabetes and Crestor.  Continue Crestor 10 mg daily Hypertension, BP controlled.  Continue HCTZ.  Follow-up as needed     Medication Adjustments/Labs and Tests Ordered: Current medicines are reviewed at length with the patient today.  Concerns regarding medicines are outlined above.  Orders Placed This Encounter  Procedures   EKG 12-Lead    No orders of the defined types were placed in this encounter.   Patient Instructions  Medication Instructions:  Your physician recommends that you continue on your current medications as directed. Please refer to the Current Medication list given to you today.  *If you need a refill on your cardiac medications before your next appointment, please call your pharmacy*   Lab Work: None ordered If you have labs (blood work) drawn today and your tests are completely normal, you will receive your results only by: MyChart Message  (if you have MyChart) OR A paper copy in the mail If you have any lab test that is abnormal or we need to change your treatment, we will call you to review the results.   Testing/Procedures: None ordered   Follow-Up: At [facility], you and your health needs are our priority.  As part of our continuing mission to provide you with exceptional heart care, we have created designated Provider Care Teams.  These Care Teams include your primary Cardiologist (physician) and Advanced Practice Providers (APPs -  Physician Assistants and Nurse Practitioners) who all work together to provide you with the care you need, when you need it.  We  recommend signing up for the patient portal called "MyChart".  Sign up information is provided on this After Visit Summary.  MyChart is used to connect with patients for Virtual Visits (Telemedicine).  Patients are able to view lab/test results, encounter notes, upcoming appointments, etc.  Non-urgent messages can be sent to your provider as well.   To learn more about what you can do with MyChart, go to [email or web address].    Your next appointment:   Follow up as needed   The format for your next appointment:   In Person  Provider:   Debbe Odea, MD   Other Instructions    Signed, Debbe Odea, MD  02/19/2021 9:50 AM    [facility]

## 2021-02-19 NOTE — Patient Instructions (Signed)

## 2021-02-24 ENCOUNTER — Other Ambulatory Visit: Payer: Self-pay | Admitting: *Deleted

## 2021-02-24 MED ORDER — ROSUVASTATIN CALCIUM 20 MG PO TABS: 20.0000 mg | ORAL_TABLET | Freq: Every day | ORAL | 3 refills | Status: AC

## 2021-02-24 MED FILL — ROSUVASTATIN 20 MG TABLET: ORAL | 90 days supply | Qty: 90 | Fill #0

## 2021-03-05 MED FILL — BIKTARVY 50 MG-200 MG-25 MG TABLET: ORAL | 30 days supply | Qty: 30 | Fill #3

## 2021-04-05 MED FILL — BIKTARVY 50 MG-200 MG-25 MG TABLET: ORAL | 30 days supply | Qty: 30 | Fill #4

## 2021-04-05 MED FILL — HYDROCHLOROTHIAZIDE 25 MG TABLET: ORAL | 90 days supply | Qty: 90 | Fill #1

## 2021-05-05 MED FILL — BIKTARVY 50 MG-200 MG-25 MG TABLET: ORAL | 30 days supply | Qty: 30 | Fill #5

## 2021-05-28 MED FILL — ROSUVASTATIN 20 MG TABLET: ORAL | 90 days supply | Qty: 90 | Fill #1

## 2021-06-02 MED FILL — BIKTARVY 50 MG-200 MG-25 MG TABLET: ORAL | 30 days supply | Qty: 30 | Fill #6

## 2021-07-02 MED FILL — BIKTARVY 50 MG-200 MG-25 MG TABLET: ORAL | 30 days supply | Qty: 30 | Fill #7

## 2021-07-02 MED FILL — HYDROCHLOROTHIAZIDE 25 MG TABLET: ORAL | 90 days supply | Qty: 90 | Fill #2

## 2021-08-03 MED FILL — BIKTARVY 50 MG-200 MG-25 MG TABLET: ORAL | 30 days supply | Qty: 30 | Fill #8

## 2021-08-31 DIAGNOSIS — B20 Human immunodeficiency virus [HIV] disease: Principal | ICD-10-CM

## 2021-09-03 MED FILL — BIKTARVY 50 MG-200 MG-25 MG TABLET: ORAL | 30 days supply | Qty: 30 | Fill #9

## 2021-09-03 MED FILL — ROSUVASTATIN 20 MG TABLET: ORAL | 90 days supply | Qty: 90 | Fill #2

## 2021-09-23 MED ORDER — BIKTARVY 50 MG-200 MG-25 MG TABLET
ORAL_TABLET | 11 refills | 0 days
Start: 2021-09-23 — End: ?

## 2021-09-23 MED ORDER — CALTRATE 600 PLUS D  600 MG-20 MCG (800 UNIT) CHEWABLE TABLET
ORAL_TABLET | Freq: Every day | ORAL | 11 refills | 0.00000 days
Start: 2021-09-23 — End: ?

## 2021-09-23 MED ORDER — HYDROCHLOROTHIAZIDE 25 MG TABLET
ORAL_TABLET | Freq: Every day | ORAL | 2 refills | 90 days
Start: 2021-09-23 — End: ?

## 2021-09-23 MED ORDER — ROSUVASTATIN 20 MG TABLET
ORAL_TABLET | Freq: Every day | ORAL | 2 refills | 90 days
Start: 2021-09-23 — End: ?

## 2021-09-23 MED ORDER — METFORMIN 500 MG TABLET
ORAL_TABLET | Freq: Every day | ORAL | 1 refills | 90 days
Start: 2021-09-23 — End: ?

## 2021-09-28 MED FILL — HYDROCHLOROTHIAZIDE 25 MG TABLET: ORAL | 90 days supply | Qty: 90 | Fill #0

## 2021-09-28 MED FILL — BIKTARVY 50 MG-200 MG-25 MG TABLET: ORAL | 30 days supply | Qty: 30 | Fill #10

## 2021-11-01 MED FILL — METFORMIN 500 MG TABLET: ORAL | 90 days supply | Qty: 90 | Fill #0

## 2021-11-01 MED FILL — BIKTARVY 50 MG-200 MG-25 MG TABLET: ORAL | 30 days supply | Qty: 30 | Fill #11

## 2021-11-30 MED FILL — ROSUVASTATIN 20 MG TABLET: ORAL | 90 days supply | Qty: 90 | Fill #0

## 2021-11-30 MED FILL — BIKTARVY 50 MG-200 MG-25 MG TABLET: 30 days supply | Qty: 30 | Fill #0

## 2021-12-23 MED FILL — HYDROCHLOROTHIAZIDE 25 MG TABLET: ORAL | 90 days supply | Qty: 90 | Fill #1

## 2021-12-23 MED FILL — BIKTARVY 50 MG-200 MG-25 MG TABLET: 30 days supply | Qty: 30 | Fill #1

## 2022-01-25 MED FILL — METFORMIN 500 MG TABLET: ORAL | 90 days supply | Qty: 90 | Fill #1

## 2022-01-25 MED FILL — BIKTARVY 50 MG-200 MG-25 MG TABLET: 30 days supply | Qty: 30 | Fill #2

## 2022-02-25 MED FILL — BIKTARVY 50 MG-200 MG-25 MG TABLET: 30 days supply | Qty: 30 | Fill #3

## 2022-02-25 MED FILL — ROSUVASTATIN 20 MG TABLET: ORAL | 90 days supply | Qty: 90 | Fill #1

## 2022-03-18 MED FILL — HYDROCHLOROTHIAZIDE 25 MG TABLET: ORAL | 90 days supply | Qty: 90 | Fill #2

## 2022-03-29 MED FILL — BIKTARVY 50 MG-200 MG-25 MG TABLET: 30 days supply | Qty: 30 | Fill #4

## 2022-04-19 ENCOUNTER — Other Ambulatory Visit: Payer: Self-pay | Admitting: Nurse Practitioner

## 2022-04-19 DIAGNOSIS — Z1231 Encounter for screening mammogram for malignant neoplasm of breast: Secondary | ICD-10-CM

## 2022-04-27 MED FILL — BIKTARVY 50 MG-200 MG-25 MG TABLET: 30 days supply | Qty: 30 | Fill #5

## 2022-05-12 ENCOUNTER — Ambulatory Visit
Admission: RE | Admit: 2022-05-12 | Discharge: 2022-05-12 | Disposition: A | Discharge status: Home / Self Care | Payer: Commercial Managed Care - PPO | Source: Ambulatory Visit | Attending: Nurse Practitioner | Admitting: Nurse Practitioner

## 2022-05-12 DIAGNOSIS — Z1231 Encounter for screening mammogram for malignant neoplasm of breast: Secondary | ICD-10-CM | POA: Insufficient documentation

## 2022-05-13 ENCOUNTER — Other Ambulatory Visit: Payer: Self-pay | Admitting: Nurse Practitioner

## 2022-05-16 MED ORDER — METFORMIN 500 MG TABLET
ORAL_TABLET | Freq: Every day | ORAL | 2 refills | 90 days
Start: 2022-05-16 — End: ?

## 2022-05-17 MED FILL — METFORMIN 500 MG TABLET: ORAL | 90 days supply | Qty: 90 | Fill #0

## 2022-05-18 ENCOUNTER — Other Ambulatory Visit: Payer: Self-pay | Admitting: Nurse Practitioner

## 2022-05-18 DIAGNOSIS — R928 Other abnormal and inconclusive findings on diagnostic imaging of breast: Secondary | ICD-10-CM

## 2022-05-18 DIAGNOSIS — N63 Unspecified lump in unspecified breast: Secondary | ICD-10-CM

## 2022-05-19 ENCOUNTER — Ambulatory Visit
Admission: RE | Admit: 2022-05-19 | Discharge: 2022-05-19 | Disposition: A | Discharge status: Home / Self Care | Payer: Commercial Managed Care - PPO | Source: Ambulatory Visit | Attending: Nurse Practitioner | Admitting: Nurse Practitioner

## 2022-05-19 DIAGNOSIS — R928 Other abnormal and inconclusive findings on diagnostic imaging of breast: Secondary | ICD-10-CM | POA: Insufficient documentation

## 2022-05-19 DIAGNOSIS — N63 Unspecified lump in unspecified breast: Secondary | ICD-10-CM

## 2022-05-31 MED FILL — ROSUVASTATIN 20 MG TABLET: ORAL | 90 days supply | Qty: 90 | Fill #2

## 2022-05-31 MED FILL — BIKTARVY 50 MG-200 MG-25 MG TABLET: 30 days supply | Qty: 30 | Fill #6

## 2022-06-15 MED ORDER — HYDROCHLOROTHIAZIDE 25 MG TABLET
ORAL_TABLET | Freq: Every day | ORAL | 2 refills | 90 days
Start: 2022-06-15 — End: ?

## 2022-06-17 MED FILL — HYDROCHLOROTHIAZIDE 25 MG TABLET: ORAL | 90 days supply | Qty: 90 | Fill #0

## 2022-06-29 MED FILL — BIKTARVY 50 MG-200 MG-25 MG TABLET: 30 days supply | Qty: 30 | Fill #7

## 2022-07-28 MED FILL — BIKTARVY 50 MG-200 MG-25 MG TABLET: 30 days supply | Qty: 30 | Fill #8

## 2022-08-28 MED FILL — BIKTARVY 50 MG-200 MG-25 MG TABLET: 30 days supply | Qty: 30 | Fill #9

## 2022-08-31 MED FILL — METFORMIN 500 MG TABLET: ORAL | 90 days supply | Qty: 90 | Fill #1

## 2022-09-14 MED FILL — HYDROCHLOROTHIAZIDE 25 MG TABLET: ORAL | 90 days supply | Qty: 90 | Fill #1

## 2022-09-26 MED ORDER — ROSUVASTATIN 20 MG TABLET
ORAL_TABLET | Freq: Every day | ORAL | 2 refills | 90 days
Start: 2022-09-26 — End: ?

## 2022-09-26 MED ORDER — BICTEGRAVIR 50 MG-EMTRICITABINE 200 MG-TENOFOVIR ALAFENAM 25 MG TABLET
ORAL_TABLET | 11 refills | 0 days
Start: 2022-09-26 — End: ?

## 2022-09-29 MED ORDER — ROSUVASTATIN 20 MG TABLET
ORAL_TABLET | 3 refills | 0 days
Start: 2022-09-29 — End: ?

## 2022-09-29 MED ORDER — CALTRATE 600 PLUS D  600 MG-20 MCG (800 UNIT) CHEWABLE TABLET
ORAL_TABLET | Freq: Every day | ORAL | 11 refills | 0 days
Start: 2022-09-29 — End: ?

## 2022-09-29 MED ORDER — DOVATO 50 MG-300 MG TABLET
ORAL_TABLET | Freq: Every day | ORAL | 11 refills | 0 days
Start: 2022-09-29 — End: ?

## 2022-09-29 MED ORDER — FLUTICASONE PROPIONATE 50 MCG/ACTUATION NASAL SPRAY,SUSPENSION
11 refills | 0 days
Start: 2022-09-29 — End: ?

## 2022-09-29 MED ORDER — CETIRIZINE 10 MG TABLET
ORAL_TABLET | 3 refills | 0 days
Start: 2022-09-29 — End: ?

## 2022-09-29 MED ORDER — METFORMIN 500 MG TABLET
ORAL_TABLET | 3 refills | 0 days
Start: 2022-09-29 — End: ?

## 2022-09-29 MED ORDER — HYDROCHLOROTHIAZIDE 25 MG TABLET
ORAL_TABLET | 3 refills | 0 days
Start: 2022-09-29 — End: ?

## 2022-09-29 MED FILL — BIKTARVY 50 MG-200 MG-25 MG TABLET: 30 days supply | Qty: 30 | Fill #0

## 2022-09-29 MED FILL — ROSUVASTATIN 20 MG TABLET: ORAL | 90 days supply | Qty: 90 | Fill #0

## 2022-10-04 DIAGNOSIS — B20 Human immunodeficiency virus [HIV] disease: Principal | ICD-10-CM

## 2022-10-05 ENCOUNTER — Other Ambulatory Visit: Payer: Self-pay | Admitting: Nurse Practitioner

## 2022-10-05 DIAGNOSIS — Z780 Asymptomatic menopausal state: Secondary | ICD-10-CM

## 2022-10-06 MED FILL — FLUTICASONE PROPIONATE 50 MCG/ACTUATION NASAL SPRAY,SUSPENSION: 30 days supply | Qty: 16 | Fill #0

## 2022-10-06 MED FILL — CALTRATE 600 PLUS D  600 MG-20 MCG (800 UNIT) CHEWABLE TABLET: ORAL | 60 days supply | Qty: 60 | Fill #0

## 2022-10-06 MED FILL — CETIRIZINE 10 MG TABLET: 100 days supply | Qty: 100 | Fill #0

## 2022-10-06 NOTE — Unmapped (Signed)
Shipment of Dovato:    Ms.Espinoza has opted out of [facility] Specialty Services for her specialty medication Dovato.  The patient is aware they must call the pharmacy for refills and may re-enroll at any time.    I notified her of that the telephone options are changing soon.  I told her that as of today, the telephone number is [phone] option 3.    I have scheduled the Dovato to be delivered to the verified prescription address in Epic WAM on 03/01//24 via our Next Day Courier.     Roderic Palau, PharmD  [facility] Specialty Pharmacist

## 2022-10-06 NOTE — Unmapped (Signed)
Specialty Medication(s): Dovato 50-300mg     Rebecca Barton has been dis-enrolled from the [facility] specialty pharmacy services due to opting out. The patient is aware that they must call the pharmacy for refills and may re-enroll at any time.    Additional information provided to the patient: I told her it is best to call 7 to 10 days in advance of needing a refill.    Roderic Palau, PharmD  [facility] Specialty Pharmacist

## 2022-10-06 NOTE — Unmapped (Signed)
[facility] Specialty Medication Onboarding    Specialty Medication: DOVATO 50-300 mg Tab (dolutegravir-lamiVUDine)  Prior Authorization: Not Required   Financial Assistance: Yes - copay card approved as secondary   Final Copay/Day Supply: $0 / 30    Insurance Restrictions: None     Notes to Pharmacist:     The triage team has completed the benefits investigation and has determined that the patient is able to fill this medication at [facility]. Please contact the patient to complete the onboarding or follow up with the prescribing physician as needed.

## 2022-10-21 MED FILL — DOVATO 50 MG-300 MG TABLET: ORAL | 30 days supply | Qty: 30 | Fill #0

## 2022-11-22 MED FILL — DOVATO 50 MG-300 MG TABLET: ORAL | 30 days supply | Qty: 30 | Fill #1

## 2022-12-06 MED FILL — HYDROCHLOROTHIAZIDE 25 MG TABLET: 90 days supply | Qty: 90 | Fill #0

## 2022-12-06 MED FILL — METFORMIN 500 MG TABLET: ORAL | 90 days supply | Qty: 90 | Fill #2

## 2022-12-27 MED FILL — DOVATO 50 MG-300 MG TABLET: ORAL | 30 days supply | Qty: 30 | Fill #2

## 2023-01-03 ENCOUNTER — Ambulatory Visit
Admission: RE | Admit: 2023-01-03 | Discharge: 2023-01-03 | Disposition: A | Discharge status: Home / Self Care | Payer: BC Managed Care – PPO | Source: Ambulatory Visit | Attending: Nurse Practitioner | Admitting: Nurse Practitioner

## 2023-01-03 DIAGNOSIS — Z780 Asymptomatic menopausal state: Secondary | ICD-10-CM | POA: Diagnosis present

## 2023-01-06 MED FILL — ROSUVASTATIN 20 MG TABLET: 90 days supply | Qty: 90 | Fill #0

## 2023-01-06 MED FILL — CETIRIZINE 10 MG TABLET: 100 days supply | Qty: 100 | Fill #1

## 2023-01-06 MED FILL — CALTRATE 600 PLUS D  600 MG-20 MCG (800 UNIT) CHEWABLE TABLET: ORAL | 60 days supply | Qty: 60 | Fill #1

## 2023-01-24 MED FILL — DOVATO 50 MG-300 MG TABLET: ORAL | 30 days supply | Qty: 30 | Fill #3

## 2023-02-22 MED FILL — DOVATO 50 MG-300 MG TABLET: ORAL | 30 days supply | Qty: 30 | Fill #4

## 2023-03-07 ENCOUNTER — Emergency Department: Payer: BC Managed Care – PPO

## 2023-03-07 ENCOUNTER — Encounter: Payer: Self-pay | Admitting: Emergency Medicine

## 2023-03-07 ENCOUNTER — Emergency Department
Admission: EM | Admit: 2023-03-07 | Discharge: 2023-03-07 | Disposition: A | Discharge status: Home / Self Care | Payer: BC Managed Care – PPO | Source: Home / Self Care | Attending: Emergency Medicine | Admitting: Emergency Medicine

## 2023-03-07 ENCOUNTER — Other Ambulatory Visit: Payer: Self-pay

## 2023-03-07 DIAGNOSIS — K652 Spontaneous bacterial peritonitis: Secondary | ICD-10-CM | POA: Diagnosis not present

## 2023-03-07 DIAGNOSIS — R180 Malignant ascites: Secondary | ICD-10-CM | POA: Insufficient documentation

## 2023-03-07 DIAGNOSIS — C569 Malignant neoplasm of unspecified ovary: Secondary | ICD-10-CM | POA: Insufficient documentation

## 2023-03-07 DIAGNOSIS — E119 Type 2 diabetes mellitus without complications: Secondary | ICD-10-CM | POA: Insufficient documentation

## 2023-03-07 DIAGNOSIS — R109 Unspecified abdominal pain: Secondary | ICD-10-CM | POA: Diagnosis not present

## 2023-03-07 DIAGNOSIS — Z21 Asymptomatic human immunodeficiency virus [HIV] infection status: Secondary | ICD-10-CM | POA: Insufficient documentation

## 2023-03-07 DIAGNOSIS — I10 Essential (primary) hypertension: Secondary | ICD-10-CM | POA: Insufficient documentation

## 2023-03-07 LAB — COMPREHENSIVE METABOLIC PANEL
ALT: 16 U/L (ref 0–44)
AST: 33 U/L (ref 15–41)
Albumin: 3.8 g/dL (ref 3.5–5.0)
Alkaline Phosphatase: 30 U/L — ABNORMAL LOW (ref 38–126)
Anion gap: 12 (ref 5–15)
BUN: 13 mg/dL (ref 6–20)
CO2: 23 mmol/L (ref 22–32)
Calcium: 9.1 mg/dL (ref 8.9–10.3)
Chloride: 102 mmol/L (ref 98–111)
Creatinine, Ser: 1.01 mg/dL — ABNORMAL HIGH (ref 0.44–1.00)
GFR, Estimated: >60 mL/min (ref >60)
Glucose, Bld: 146 mg/dL — ABNORMAL HIGH (ref 70–99)
Potassium: 3.5 mmol/L (ref 3.5–5.1)
Sodium: 137 mmol/L (ref 135–145)
Total Bilirubin: 0.9 mg/dL (ref 0.3–1.2)
Total Protein: 7.4 g/dL (ref 6.5–8.1)

## 2023-03-07 LAB — CBC
HCT: 47.4 % — ABNORMAL HIGH (ref 36.0–46.0)
Hemoglobin: 15.4 g/dL — ABNORMAL HIGH (ref 12.0–15.0)
MCH: 30.3 pg (ref 26.0–34.0)
MCHC: 32.5 g/dL (ref 30.0–36.0)
MCV: 93.1 fL (ref 80.0–100.0)
Platelets: 354 10*3/uL (ref 150–400)
RBC: 5.09 MIL/uL (ref 3.87–5.11)
RDW: 13.6 % (ref 11.5–15.5)
WBC: 9.2 10*3/uL (ref 4.0–10.5)
nRBC: 0 % (ref 0.0–0.2)

## 2023-03-07 LAB — LIPASE, BLOOD: Lipase: 25 U/L (ref 11–51)

## 2023-03-07 MED ORDER — IOHEXOL 300 MG/ML  SOLN
100.0000 mL | Freq: Once | INTRAMUSCULAR | Status: AC | PRN
  Administered 2023-03-07: 100 mL via INTRAVENOUS

## 2023-03-07 MED ORDER — HYDROCODONE-ACETAMINOPHEN 5-325 MG PO TABS: 1.0000 | ORAL_TABLET | Freq: Three times a day (TID) | ORAL | 0 refills | Status: AC | PRN

## 2023-03-07 MED ORDER — FAMOTIDINE 20 MG PO TABS: 20.0000 mg | ORAL_TABLET | Freq: Every day | ORAL | 0 refills | Status: AC

## 2023-03-07 NOTE — H&P (Signed)
Consult History and Physical   SERVICE: Gynecology unassigned GYN patient   Patient Name: Julie Nolan Patient MRN:   [id number]  CC: pelvic pressure and abdominal distension 2 months   HPI: Julie Nolan is a 57 y.o. G1P0 Noticed abd. Distension for the last 2 months. Increasing reflux . No SOB , No CP , No N/V.  + 20 yr h/o HIV on meds and sees Infectious ds [facility] S/[facility] for fibroid 20 yrs ago  Review of Systems: positives in bold GEN:   fevers, chills, weight changes, appetite changes, fatigue, night sweats HEENT:  HA, vision changes, hearing loss, congestion, rhinorrhea, sinus pressure, dysphagia CV:   CP, palpitations PULM:  SOB, cough GI:  abd pain, N/V/D/C GU:  dysuria, urgency, frequency MSK:  arthralgias, myalgias, back pain, swelling SKIN:  rashes, color changes, pallor NEURO:  numbness, weakness, tingling, seizures, dizziness, tremors PSYCH:  depression, anxiety, behavioral problems, confusion  HEME/LYMPH:  easy bruising or bleeding ENDO:  heat/cold intolerance  Past Obstetrical History: OB History   No obstetric history on file.     Past Gynecologic History: Review of Systems: A full review of systems was performed and negative except as noted in the HPI.   Eyes: no vision change  Ears: left ear pain  Oropharynx: no sore throat  Pulmonary . No shortness of breath , no hemoptysis Cardiovascular: no chest pain , no irregular heart beat  Gastrointestinal:no blood in stool . No diarrhea, no constipation Uro gynecologic: no dysuria , +abdominal distension Neurologic : no seizure , no migraines    Musculoskeletal: no muscular weakness  Past Medical History: Past Medical History:  Diagnosis Date   Diabetes mellitus without complication (HCC)    HIV (human immunodeficiency virus infection) (HCC)    Hyperlipidemia    Hypertension     Past Surgical History:   Past Surgical History:  Procedure Laterality Date   COLONOSCOPY WITH PROPOFOL N/A  05/29/2020   Procedure: COLONOSCOPY WITH PROPOFOL;  Surgeon: Midge Minium, MD;  Location: [facility];  Service: Endoscopy;  Laterality: N/A;   PARTIAL HYSTERECTOMY     TONSILLECTOMY      Family History:  family history is not on file.  Social History:  Social History   Socioeconomic History   Marital status: Married    Spouse name: Not on file   Number of children: Not on file   Years of education: Not on file   Highest education level: Not on file  Occupational History   Not on file  Tobacco Use   Smoking status: Former    Current packs/day: 0.00    Types: Cigarettes    Quit date: 1995    Years since quitting: 29.5   Smokeless tobacco: Never  Vaping Use   Vaping status: Never Used  Substance and Sexual Activity   Alcohol use: Yes    Alcohol/week: 1.0 - 2.0 standard drink of alcohol    Types: 1 - 2 Cans of beer per week   Drug use: Not on file   Sexual activity: Not on file  Other Topics Concern   Not on file  Social History Narrative   Not on file   Social Determinants of Health   Financial Resource Strain: Not on file  Food Insecurity: Not on file  Transportation Needs: Not on file  Physical Activity: Not on file  Stress: Not on file  Social Connections: Not on file  Intimate Partner Violence: Not on file    Home  Medications:  Medications reconciled in EPIC  No current facility-administered medications on file prior to encounter.   Current Outpatient Medications on File Prior to Encounter  Medication Sig Dispense Refill   bictegravir-emtricitabine-tenofovir AF (BIKTARVY) 50-200-25 MG TABS tablet Take by mouth daily.     CALTRATE 600+D3 SOFT 600-800 MG-UNIT CHEW Chew 1 tablet by mouth daily.     hydrochlorothiazide (MICROZIDE) 12.5 MG capsule Take 12.5 mg by mouth daily.     metFORMIN (GLUCOPHAGE) 500 MG tablet Take 500 mg by mouth daily.     Multiple Vitamin (MULTIVITAMIN ADULT PO) Take by mouth.     rosuvastatin (CRESTOR) 20 MG tablet Take 1  tablet (20 mg total) by mouth daily. 90 tablet 3    Allergies:  No Known Allergies  Physical Exam:  Temp:  [98.5 F (36.9 C)] 98.5 F (36.9 C) (07/16 1046) Pulse Rate:  [69] 69 (07/16 1046) Resp:  [18] 18 (07/16 1046) BP: (118)/(90) 118/90 (07/16 1046) SpO2:  [99 %] 99 % (07/16 1046) Weight:  [73.1 kg] 73.1 kg (07/16 1115)   General Appearance:  Well developed, well nourished, no acute distress, alert and oriented x3 HEENT:  Normocephalic atraumatic, extraocular movements intact, moist mucous membranes Cardiovascular:  Normal S1/S2, regular rate and rhythm, no murmurs Pulmonary:  clear to auscultation, no wheezes, rales or rhonchi, symmetric air entry, good air exchange Abdomen:  Bowel sounds present Hypertympanic with percussion nontender, +++distended, , no epigastric pain, no rebound TTP Extremities:  Full range of motion, no pedal edema, 2+ distal pulses, no tenderness Skin:  normal coloration and turgor, no rashes, no suspicious skin lesions noted  Neurologic:  Cranial nerves 2-12 grossly intact, normal muscle tone, strength 5/5 all four extremities Psychiatric:  Normal mood and affect, appropriate, no AH/VH Pelvic: bimanual exam only : NEFG, no vulvar masses or lesions, normal vaginal mucosa, no vaginal bleeding or discharge, no mass palpated   Labs/Studies:   CBC and Coags:  Lab Results  Component Value Date   WBC 9.2 03/07/2023   HGB 15.4 (H) 03/07/2023   HCT 47.4 (H) 03/07/2023   MCV 93.1 03/07/2023   PLT 354 03/07/2023   CMP:  Lab Results  Component Value Date   NA 137 03/07/2023   K 3.5 03/07/2023   CL 102 03/07/2023   CO2 23 03/07/2023   BUN 13 03/07/2023   CREATININE 1.01 (H) 03/07/2023   CREATININE 0.68 12/06/2012   PROT 7.4 03/07/2023   BILITOT 0.9 03/07/2023   ALT 16 03/07/2023   AST 33 03/07/2023   ALKPHOS 30 (L) 03/07/2023    Other Imaging: CT ABDOMEN PELVIS W CONTRAST  Result Date: 03/07/2023 CLINICAL DATA:  Abdominal pain for 2 months,  HIV with recent medication change * Tracking Code: BO * EXAM: CT ABDOMEN AND PELVIS WITH CONTRAST TECHNIQUE: Multidetector CT imaging of the abdomen and pelvis was performed using the standard protocol following bolus administration of intravenous contrast. RADIATION DOSE REDUCTION: This exam was performed according to the departmental dose-optimization program which includes automated exposure control, adjustment of the mA and/or kV according to patient size and/or use of iterative reconstruction technique. CONTRAST:  OMNIPAQUE IOHEXOL 300 MG/ML  SOLN COMPARISON:  None Available. FINDINGS: Lower chest: No acute abnormality. Hepatobiliary: No solid liver abnormality is seen. No gallstones, gallbladder wall thickening, or biliary dilatation. Pancreas: Unremarkable. No pancreatic ductal dilatation or surrounding inflammatory changes. Spleen: Normal in size without significant abnormality. Adrenals/Urinary Tract: Adrenal glands are unremarkable. Kidneys are normal, without renal calculi, solid lesion, or  hydronephrosis. Bladder is unremarkable. Stomach/Bowel: Stomach is within normal limits. Appendix appears normal. No evidence of bowel wall thickening, distention, or inflammatory changes. Vascular/Lymphatic: Aortic atherosclerosis. No enlarged abdominal or pelvic lymph nodes. Reproductive: Unusual, large mixed solid and cystic lesion within the central pelvis and low abdomen, with a lobular, thickened, enhancing contour and multiple enhancing internal solid nodular components. Overall lesion measures at least 16.9 x 14.8 cm (series 2, image 61), largest solid component inferiorly measures 7.7 x 5.2 cm (series 2, image 66, series 6, image 62). Status post hysterectomy. Normal ovarian tissue not confidently visualized. Other: No abdominal wall hernia or abnormality. Large volume ascites. Mild, diffuse peritoneal thickening and enhancement. Stranding and nodularity of the ventral peritoneum and/or omentum (series  2, image 48). Musculoskeletal: No acute or significant osseous findings. IMPRESSION: 1. Unusual, large mixed solid and cystic lesion within the central pelvis and low abdomen, with a lobular, thickened, enhancing contour and multiple enhancing internal solid nodular components. Overall lesion measures at least 16.9 x 14.8 cm, largest solid component inferiorly measures 7.7 x 5.2 cm. 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 2. Large volume ascites. Mild, diffuse peritoneal thickening and enhancement. Stranding and nodularity of the ventral peritoneum and/or omentum. 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 3. No evidence of lymphadenopathy in the abdomen or pelvis. Aortic Atherosclerosis (ICD10-I70.0). Electronically Signed   By: Jearld Lesch M.D.   On: 03/07/2023 13:36     Assessment / Plan:   Julie Nolan is a 57 y.o. No obstetric history on file. who presents withabdominal distension and pressure . CTscan concerning for pelvic mass likely ovarian cancer with significant ascites .  Spoke with Dr Sonia Side, Gyn/ Onc, who will see her at [facility] cancer center tomorrow . Additional CTChest and CA-125 ordered  60 minutes in patient care  Thank you for the opportunity to be involved with this pt's care.

## 2023-03-07 NOTE — Discharge Instructions (Addendum)
Your exam, labs, and CT scan have revealed a large complex lesion on your ovary.  You have been evaluated by gynecology here, and we will be seen by a gynecology specialist tomorrow.  You should follow-up with Dr. Sonia Side at [facility] gynecology tomorrow at 11 am. Take the pain medicine as needed.

## 2023-03-07 NOTE — ED Triage Notes (Signed)
Patient to ED via POV for generalized abd pain x2 months. PT states it started after they changed her HIV meds.

## 2023-03-07 NOTE — ED Provider Notes (Signed)
[facility] Emergency Department Provider Note     Event Date/Time   First MD Initiated Contact with Patient 03/07/23 1113     (approximate)   History   Abdominal Pain   HPI  Julie Nolan is a 57 y.o. female c/o bloating, abd weight gain (+10 lbs), abd fullness and general "soreness", and heartburn/reflux. She notes a history of HIV, HTN, HLD, DM, chronic constipation, and recently diagnosed osteopenia. She denies any SOB, cough, edema, or CP. She was wondering if her symptoms were due to her being started on an OTC vitamin D chewable, and her meds being changed from [facility] to [facility] 2 months prior. She also assumed her symptoms may have been due to her chronic constipation, so she started MOM for constipation 2 weeks ago. Her PSH includes partial hysterectomy. She denies FCS, NVD.     Physical Exam   Triage Vital Signs: ED Triage Vitals  Encounter Vitals Group     BP 03/07/23 1046 (!) 118/90     Systolic BP Percentile --      Diastolic BP Percentile --      Pulse Rate 03/07/23 1046 69     Resp 03/07/23 1046 18     Temp 03/07/23 1046 98.5 F (36.9 C)     Temp Source 03/07/23 1046 Oral     SpO2 03/07/23 1046 99 %     Weight 03/07/23 1115 161 lb 2.5 oz (73.1 kg)     Height 03/07/23 1115 5\' 3"  (1.6 m)     Head Circumference --      Peak Flow --      Pain Score 03/07/23 1046 10     Pain Loc --      Pain Education --      Exclude from Growth Chart --     Most recent vital signs: Vitals:   03/07/23 1046 03/07/23 1552  BP: (!) 118/90 (!) 122/107  Pulse: 69 (!) 115  Resp: 18 17  Temp: 98.5 F (36.9 C) 97.8 F (36.6 C)  SpO2: 99% 99%    General Awake, no distress. NAD HEENT NCAT. PERRL. EOMI. No rhinorrhea. Mucous membranes are moist.  CV:  Good peripheral perfusion. RRR RESP:  Normal effort. CTA ABD:  Severely distended. Tense abd but not tender to palp. Distant bowel sounds noted. No CVA tenderness elicited.  GU:   deferred   ED  Results / Procedures / Treatments   Labs (all labs ordered are listed, but only abnormal results are displayed) Labs Reviewed  COMPREHENSIVE METABOLIC PANEL - Abnormal; Notable for the following components:      Result Value   Glucose, Bld 146 (*)    Creatinine, Ser 1.01 (*)    Alkaline Phosphatase 30 (*)    All other components within normal limits  CBC - Abnormal; Notable for the following components:   Hemoglobin 15.4 (*)    HCT 47.4 (*)    All other components within normal limits  LIPASE, BLOOD     EKG    RADIOLOGY  I personally viewed and evaluated these images as part of my medical decision making, as well as reviewing the written report by the radiologist.  ED Provider Interpretation: large 17 cm complex pelvic mass noted on ABD/Pelvis CT. No signs of metastatic dz on chest CT  CT CHEST WO CONTRAST  Result Date: 03/07/2023 CLINICAL DATA:  Evaluate for metastatic disease. Newly diagnosed ovarian cancer. * Tracking Code: BO *. EXAM: CT CHEST WITHOUT  CONTRAST TECHNIQUE: Multidetector CT imaging of the chest was performed following the standard protocol without IV contrast. RADIATION DOSE REDUCTION: This exam was performed according to the departmental dose-optimization program which includes automated exposure control, adjustment of the mA and/or kV according to patient size and/or use of iterative reconstruction technique. COMPARISON:  None FINDINGS: Cardiovascular: Heart size is within normal limits. Small anterior pericardial effusion. Aortic atherosclerosis. Mediastinum/Nodes: Thyroid gland, trachea, and esophagus are unremarkable. Small hiatal hernia noted. No enlarged mediastinal or axillary lymph nodes. Lungs/Pleura: Trace pleural fluid identified within the posterior lung bases. Areas of subsegmental atelectasis with volume loss noted within both lower lobes, right middle lobe and inferior lingula. No airspace consolidation identified. Perifissural nodule along the oblique  fissure in the left midlung measures 5 mm and most likely represents a benign intrapulmonary lymph node. No follow-up imaging recommended. No suspicious pulmonary nodule or mass identified bilaterally. Upper Abdomen: Large volume of abdominal ascites is identified. Deferred to the CT performed earlier today for further details regarding the imaged portions of the upper abdomen. Musculoskeletal: No chest wall mass or suspicious bone lesions identified. IMPRESSION: 1. No signs of metastatic disease to the chest. 2. Trace pleural fluid within the posterior lung bases. 3. Areas of subsegmental atelectasis with volume loss noted within both lower lobes, right middle lobe and inferior lingula. 4. Large volume of abdominal ascites. Refer to CT AP report from earlier today. 5. Small hiatal hernia. 6.  Aortic Atherosclerosis (ICD10-I70.0). Electronically Signed   By: Signa Kell M.D.   On: 03/07/2023 15:58     PROCEDURES:  Critical Care performed: No  Procedures   MEDICATIONS ORDERED IN ED: Medications  iohexol (OMNIPAQUE) 300 MG/ML solution 100 mL (100 mLs Intravenous Contrast Given 03/07/23 1210)     IMPRESSION / MDM / ASSESSMENT AND PLAN / ED COURSE  I reviewed the triage vital signs and the nursing notes.                              Differential diagnosis includes, but is not limited to, ovarian cyst, ovarian torsion, acute appendicitis, diverticulitis, urinary tract infection/pyelonephritis, endometriosis, bowel obstruction, colitis, renal colic, gastroenteritis, hernia, fibroids, endometriosis, pregnancy related pain including ectopic pregnancy, etc.   Patient's presentation is most consistent with acute presentation with potential threat to life or bodily function.  ----------------------------------------- 2:20 PM on 03/07/2023 ----------------------------------------- S/W Dr. Feliberto Gottron: he will evaluate the patient in the ED. he has performed a bimanual pelvic exam, requested a  chest CT to evaluate for metastatic disease.  Patient's diagnosis is consistent with large complex pelvic mass with associated malignant ascites, confirmed on contrasted CT imaging.  Patient was evaluated by Dr. Feliberto Gottron of the GYN service in the ED, and he has arranged for the patient to be evaluated by a gynecology specialist at [facility] tomorrow morning.  Patient will be evaluated by Dr. Sonia Side.  Patient is aware of her current diagnosis and plan for surgical intervention.  Patient will be discharged home with prescriptions for hydrocodone and famotidine.  Patient is to follow up with [facility] gynecology as scheduled, as needed or otherwise directed. Patient is given ED precautions to return to the ED for any worsening or new symptoms.  Clinical Course as of 03/08/23 1427  Tue Mar 07, 2023  1408 CT ABDOMEN PELVIS W CONTRAST [NR]    Clinical Course User Index [NR] Trinna Post, MD    FINAL CLINICAL IMPRESSION(S) / ED DIAGNOSES  Final diagnoses:  Primary malignant neoplasm of ovary, unspecified laterality (HCC)  Malignant ascites     Rx / DC Orders   ED Discharge Orders          Ordered    HYDROcodone-acetaminophen (NORCO) 5-325 MG tablet  3 times daily PRN        03/07/23 1551    famotidine (PEPCID) 20 MG tablet  Daily        03/07/23 1551             Note:  This document was prepared using Dragon voice recognition software and may include unintentional dictation errors.    Lissa Hoard, PA-C 03/08/23 1429    Shaune Pollack, MD 03/12/23 [phone]

## 2023-03-08 ENCOUNTER — Inpatient Hospital Stay: Payer: BC Managed Care – PPO | Attending: Obstetrics and Gynecology | Admitting: Obstetrics and Gynecology

## 2023-03-08 ENCOUNTER — Inpatient Hospital Stay
Admission: EM | Admit: 2023-03-08 | Discharge: 2023-03-12 | DRG: 372 | Disposition: A | Discharge status: Home / Self Care | Payer: BC Managed Care – PPO | Source: Ambulatory Visit | Attending: Internal Medicine | Admitting: Internal Medicine

## 2023-03-08 ENCOUNTER — Other Ambulatory Visit: Payer: Self-pay

## 2023-03-08 ENCOUNTER — Inpatient Hospital Stay: Payer: BC Managed Care – PPO

## 2023-03-08 ENCOUNTER — Telehealth: Payer: Self-pay

## 2023-03-08 VITALS — BP 117/111 | HR 129 | Temp 97.6°F | Resp 20 | Wt 168.8 lb

## 2023-03-08 DIAGNOSIS — R188 Other ascites: Secondary | ICD-10-CM

## 2023-03-08 DIAGNOSIS — K449 Diaphragmatic hernia without obstruction or gangrene: Secondary | ICD-10-CM | POA: Diagnosis present

## 2023-03-08 DIAGNOSIS — K219 Gastro-esophageal reflux disease without esophagitis: Secondary | ICD-10-CM | POA: Insufficient documentation

## 2023-03-08 DIAGNOSIS — Z7984 Long term (current) use of oral hypoglycemic drugs: Secondary | ICD-10-CM | POA: Insufficient documentation

## 2023-03-08 DIAGNOSIS — R000 Tachycardia, unspecified: Secondary | ICD-10-CM | POA: Diagnosis present

## 2023-03-08 DIAGNOSIS — C786 Secondary malignant neoplasm of retroperitoneum and peritoneum: Secondary | ICD-10-CM | POA: Diagnosis present

## 2023-03-08 DIAGNOSIS — D582 Other hemoglobinopathies: Secondary | ICD-10-CM | POA: Diagnosis not present

## 2023-03-08 DIAGNOSIS — I10 Essential (primary) hypertension: Secondary | ICD-10-CM | POA: Diagnosis not present

## 2023-03-08 DIAGNOSIS — E785 Hyperlipidemia, unspecified: Secondary | ICD-10-CM | POA: Diagnosis present

## 2023-03-08 DIAGNOSIS — R1900 Intra-abdominal and pelvic swelling, mass and lump, unspecified site: Secondary | ICD-10-CM | POA: Diagnosis present

## 2023-03-08 DIAGNOSIS — R824 Acetonuria: Secondary | ICD-10-CM | POA: Diagnosis present

## 2023-03-08 DIAGNOSIS — Z79624 Long term (current) use of inhibitors of nucleotide synthesis: Secondary | ICD-10-CM | POA: Insufficient documentation

## 2023-03-08 DIAGNOSIS — R110 Nausea: Secondary | ICD-10-CM

## 2023-03-08 DIAGNOSIS — R102 Pelvic and perineal pain: Secondary | ICD-10-CM | POA: Diagnosis not present

## 2023-03-08 DIAGNOSIS — E119 Type 2 diabetes mellitus without complications: Secondary | ICD-10-CM | POA: Diagnosis not present

## 2023-03-08 DIAGNOSIS — E860 Dehydration: Secondary | ICD-10-CM | POA: Diagnosis present

## 2023-03-08 DIAGNOSIS — Z87891 Personal history of nicotine dependence: Secondary | ICD-10-CM | POA: Insufficient documentation

## 2023-03-08 DIAGNOSIS — R7989 Other specified abnormal findings of blood chemistry: Secondary | ICD-10-CM | POA: Diagnosis not present

## 2023-03-08 DIAGNOSIS — R180 Malignant ascites: Secondary | ICD-10-CM | POA: Diagnosis present

## 2023-03-08 DIAGNOSIS — E876 Hypokalemia: Secondary | ICD-10-CM | POA: Diagnosis not present

## 2023-03-08 DIAGNOSIS — Z86018 Personal history of other benign neoplasm: Secondary | ICD-10-CM | POA: Insufficient documentation

## 2023-03-08 DIAGNOSIS — Z90711 Acquired absence of uterus with remaining cervical stump: Secondary | ICD-10-CM

## 2023-03-08 DIAGNOSIS — R140 Abdominal distension (gaseous): Secondary | ICD-10-CM

## 2023-03-08 DIAGNOSIS — B20 Human immunodeficiency virus [HIV] disease: Secondary | ICD-10-CM | POA: Insufficient documentation

## 2023-03-08 DIAGNOSIS — R5383 Other fatigue: Secondary | ICD-10-CM | POA: Diagnosis not present

## 2023-03-08 DIAGNOSIS — I700 Atherosclerosis of aorta: Secondary | ICD-10-CM | POA: Diagnosis present

## 2023-03-08 DIAGNOSIS — R319 Hematuria, unspecified: Secondary | ICD-10-CM | POA: Diagnosis present

## 2023-03-08 DIAGNOSIS — R112 Nausea with vomiting, unspecified: Secondary | ICD-10-CM | POA: Diagnosis not present

## 2023-03-08 DIAGNOSIS — Z90710 Acquired absence of both cervix and uterus: Secondary | ICD-10-CM | POA: Insufficient documentation

## 2023-03-08 DIAGNOSIS — J9811 Atelectasis: Secondary | ICD-10-CM | POA: Diagnosis present

## 2023-03-08 DIAGNOSIS — Z79899 Other long term (current) drug therapy: Secondary | ICD-10-CM | POA: Diagnosis not present

## 2023-03-08 DIAGNOSIS — C569 Malignant neoplasm of unspecified ovary: Secondary | ICD-10-CM | POA: Diagnosis present

## 2023-03-08 DIAGNOSIS — M8580 Other specified disorders of bone density and structure, unspecified site: Secondary | ICD-10-CM | POA: Diagnosis present

## 2023-03-08 DIAGNOSIS — R809 Proteinuria, unspecified: Secondary | ICD-10-CM | POA: Diagnosis present

## 2023-03-08 DIAGNOSIS — K5900 Constipation, unspecified: Secondary | ICD-10-CM | POA: Diagnosis present

## 2023-03-08 DIAGNOSIS — K652 Spontaneous bacterial peritonitis: Principal | ICD-10-CM | POA: Diagnosis present

## 2023-03-08 DIAGNOSIS — Z21 Asymptomatic human immunodeficiency virus [HIV] infection status: Secondary | ICD-10-CM | POA: Diagnosis present

## 2023-03-08 LAB — COMPREHENSIVE METABOLIC PANEL
ALT: 16 U/L (ref 0–44)
AST: 39 U/L (ref 15–41)
Albumin: 3.5 g/dL (ref 3.5–5.0)
Alkaline Phosphatase: 32 U/L — ABNORMAL LOW (ref 38–126)
Anion gap: 7 (ref 5–15)
BUN: 19 mg/dL (ref 6–20)
CO2: 25 mmol/L (ref 22–32)
Calcium: 8.6 mg/dL — ABNORMAL LOW (ref 8.9–10.3)
Chloride: 99 mmol/L (ref 98–111)
Creatinine, Ser: 1.09 mg/dL — ABNORMAL HIGH (ref 0.44–1.00)
GFR, Estimated: 60 mL/min — ABNORMAL LOW (ref >60)
Glucose, Bld: 157 mg/dL — ABNORMAL HIGH (ref 70–99)
Potassium: 3.8 mmol/L (ref 3.5–5.1)
Sodium: 131 mmol/L — ABNORMAL LOW (ref 135–145)
Total Bilirubin: 0.8 mg/dL (ref 0.3–1.2)
Total Protein: 7.1 g/dL (ref 6.5–8.1)

## 2023-03-08 LAB — LIPASE, BLOOD: Lipase: 24 U/L (ref 11–51)

## 2023-03-08 LAB — CBC
HCT: 45.1 % (ref 36.0–46.0)
Hemoglobin: 15 g/dL (ref 12.0–15.0)
MCH: 30.5 pg (ref 26.0–34.0)
MCHC: 33.3 g/dL (ref 30.0–36.0)
MCV: 91.9 fL (ref 80.0–100.0)
Platelets: 330 10*3/uL (ref 150–400)
RBC: 4.91 MIL/uL (ref 3.87–5.11)
RDW: 13.8 % (ref 11.5–15.5)
WBC: 12.8 10*3/uL — ABNORMAL HIGH (ref 4.0–10.5)
nRBC: 0 % (ref 0.0–0.2)

## 2023-03-08 LAB — URINALYSIS, ROUTINE W REFLEX MICROSCOPIC
Bilirubin Urine: NEGATIVE
Glucose, UA: NEGATIVE mg/dL
Ketones, ur: 5 mg/dL — AB
Leukocytes,Ua: NEGATIVE
Nitrite: NEGATIVE
Protein, ur: >=300 mg/dL — AB
RBC / HPF: >50 RBC/hpf (ref 0–5)
Specific Gravity, Urine: >1.046 — ABNORMAL HIGH (ref 1.005–1.030)
pH: 5 (ref 5.0–8.0)

## 2023-03-08 LAB — GLUCOSE, CAPILLARY: Glucose-Capillary: 160 mg/dL — ABNORMAL HIGH (ref 70–99)

## 2023-03-08 MED ORDER — FAMOTIDINE 20 MG PO TABS
20.0000 mg | ORAL_TABLET | Freq: Every day | ORAL | Status: DC | PRN
  Administered 2023-03-09: 20 mg via ORAL
  Filled 2023-03-08 (×2): qty 1

## 2023-03-08 MED ORDER — DOLUTEGRAVIR-LAMIVUDINE 50-300 MG PO TABS: 1.0000 | ORAL_TABLET | Freq: Every day | ORAL | Status: DC

## 2023-03-08 MED ORDER — LAMIVUDINE 150 MG PO TABS
300.0000 mg | ORAL_TABLET | Freq: Every day | ORAL | Status: DC
  Administered 2023-03-09 – 2023-03-12 (×4): 300 mg via ORAL
  Filled 2023-03-08 (×4): qty 2

## 2023-03-08 MED ORDER — ACETAMINOPHEN 325 MG PO TABS: 650.0000 mg | ORAL_TABLET | Freq: Four times a day (QID) | ORAL | Status: DC | PRN

## 2023-03-08 MED ORDER — SODIUM CHLORIDE 0.9% FLUSH
3.0000 mL | Freq: Two times a day (BID) | INTRAVENOUS | Status: DC
  Administered 2023-03-08 – 2023-03-11 (×4): 3 mL via INTRAVENOUS

## 2023-03-08 MED ORDER — ONDANSETRON HCL 4 MG PO TABS: 4.0000 mg | ORAL_TABLET | Freq: Four times a day (QID) | ORAL | Status: DC | PRN

## 2023-03-08 MED ORDER — POLYETHYLENE GLYCOL 3350 17 G PO PACK: 17.0000 g | PACK | Freq: Every day | ORAL | Status: DC | PRN

## 2023-03-08 MED ORDER — SODIUM CHLORIDE 0.9 % IV BOLUS
1000.0000 mL | Freq: Once | INTRAVENOUS | Status: AC
  Administered 2023-03-08: 1000 mL via INTRAVENOUS

## 2023-03-08 MED ORDER — ROSUVASTATIN CALCIUM 20 MG PO TABS
20.0000 mg | ORAL_TABLET | Freq: Every day | ORAL | Status: DC
  Administered 2023-03-09 – 2023-03-12 (×4): 20 mg via ORAL
  Filled 2023-03-08 (×4): qty 1

## 2023-03-08 MED ORDER — INSULIN ASPART 100 UNIT/ML IJ SOLN
0.0000 [IU] | Freq: Three times a day (TID) | INTRAMUSCULAR | Status: DC
  Administered 2023-03-09 – 2023-03-12 (×3): 1 [IU] via SUBCUTANEOUS
  Filled 2023-03-08 (×2): qty 1

## 2023-03-08 MED ORDER — ONDANSETRON 4 MG PO TBDP
4.0000 mg | ORAL_TABLET | Freq: Once | ORAL | Status: AC
  Administered 2023-03-08: 4 mg via ORAL
  Filled 2023-03-08: qty 1

## 2023-03-08 MED ORDER — ONDANSETRON HCL 4 MG/2ML IJ SOLN
4.0000 mg | Freq: Once | INTRAMUSCULAR | Status: AC
  Administered 2023-03-08: 4 mg via INTRAVENOUS
  Filled 2023-03-08: qty 2

## 2023-03-08 MED ORDER — SODIUM CHLORIDE 0.9 % IV SOLN
2.0000 g | INTRAVENOUS | Status: DC
  Administered 2023-03-08 – 2023-03-11 (×4): 2 g via INTRAVENOUS
  Filled 2023-03-08 (×5): qty 20

## 2023-03-08 MED ORDER — HYDROMORPHONE HCL 1 MG/ML IJ SOLN
0.5000 mg | INTRAMUSCULAR | Status: DC | PRN
  Administered 2023-03-08 – 2023-03-11 (×5): 1 mg via INTRAVENOUS
  Filled 2023-03-08 (×5): qty 1

## 2023-03-08 MED ORDER — DOLUTEGRAVIR SODIUM 50 MG PO TABS
50.0000 mg | ORAL_TABLET | Freq: Every day | ORAL | Status: DC
  Administered 2023-03-09 – 2023-03-12 (×4): 50 mg via ORAL
  Filled 2023-03-08 (×5): qty 1

## 2023-03-08 MED ORDER — ONDANSETRON HCL 4 MG/2ML IJ SOLN: 4.0000 mg | Freq: Four times a day (QID) | INTRAMUSCULAR | Status: DC | PRN

## 2023-03-08 MED ORDER — ACETAMINOPHEN 650 MG RE SUPP: 650.0000 mg | Freq: Four times a day (QID) | RECTAL | Status: DC | PRN

## 2023-03-08 NOTE — Assessment & Plan Note (Signed)
-   Hold home metformin - A1c pending - SSI, sensitive

## 2023-03-08 NOTE — ED Notes (Signed)
First Nurse Note: Patient sent to ED from [facility]. Pt has ovarian cancer w/ mass and needing paracentesis- unable to get that scheduled today. Pt unable to eat since Monday due to pain. Cancer center wanting admission. Seen in ED yesterday for same.

## 2023-03-08 NOTE — Assessment & Plan Note (Signed)
Patient is presenting with 2-day history of nausea and vomiting leading to poor p.o. intake and hypovolemia.  EKG demonstrates sinus tachycardia.  I suspect symptoms may be due to large volume ascites leading to gastric compression.  - Supportive management with IV fluids and Zofran

## 2023-03-08 NOTE — Assessment & Plan Note (Signed)
-   Hold home antihypertensives given risk for hypotension in the setting of hypovolemia and upcoming paracentesis

## 2023-03-08 NOTE — Assessment & Plan Note (Addendum)
Patient presented yesterday with 2 month history of gastric distention found to have large volume ascites, suspected to be malignant given very large pelvic mass and peritoneal carcinomatosis.  Given inability to tolerate p.o. intake, will pursue inpatient paracentesis.  Although SBP is low on the differential, with a new leukocytosis and tachycardia, will cover with Rocephin until paracentesis results.  - Ultrasound-guided therapeutic and diagnostic paracentesis ordered - Ceftriaxone 2 g daily

## 2023-03-08 NOTE — Discharge Instructions (Addendum)
Take Zofran for nausea as prescribed.  Try your best to stay hydrated by drinking small sips frequently throughout the day.

## 2023-03-08 NOTE — Progress Notes (Signed)
Gynecologic Oncology Consult Visit   Referring Provider: Ihor Austin, Schermerhorn MD   Chief Concern: Pelvic mass  Subjective:  Julie Nolan is a 57 y.o. 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 Feliberto Gottron for pelvic mass, pelvic pressure and abdominal distension 2 months.    She noticed abdominal distension for the last 2 months. Fatigue, increasing reflux and unable to tolerate oral intake over the last 48 hours with emesis. She can not even tolerate liquids or take her medication. She thought she was constipated and has been taking laxatives over the last 3 weeks. She does have bowel movements.      03/08/2023 CT ABDOMEN AND PELVIS WITH CONTRAST    Reproductive: Unusual, large mixed solid and cystic lesion within the central pelvis and low abdomen, with a lobular, thickened, enhancing contour and multiple enhancing internal solid nodular components. Overall lesion measures at least 16.9 x 14.8 cm (series 2, image 61), largest solid component inferiorly measures 7.7 x 5.2 cm (series 2, image 66, series 6, image 62).    Other: No abdominal wall hernia or abnormality. Large volume ascites. Mild, diffuse peritoneal thickening and enhancement. Stranding and nodularity of the ventral peritoneum and/or omentum (series 2, image 48).   CT CHEST     Lungs/Pleura: Trace pleural fluid identified within the posterior lung bases. Areas of subsegmental atelectasis with volume loss noted within both lower lobes, right middle lobe and inferior lingula. No airspace consolidation identified. Perifissural nodule along the oblique fissure in the left midlung measures 5 mm and most likely represents a benign intrapulmonary lymph node. No follow-up imaging recommended. No suspicious pulmonary nodule or mass identified bilaterally.   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   Cough 03/16/2012   Hypercholesteremia 03/16/2012   Hypertension 12/16/2011    Past Medical History: Past Medical History:  Diagnosis Date   Diabetes mellitus without complication (HCC)    HIV (human immunodeficiency virus infection) (HCC)    Hyperlipidemia    Hypertension     Past Surgical History: Past Surgical History:  Procedure Laterality Date   COLONOSCOPY WITH PROPOFOL N/A 05/29/2020   Procedure: COLONOSCOPY WITH PROPOFOL;  Surgeon: Midge Minium, MD;  Location: [facility];  Service: Endoscopy;  Laterality: N/A;   PARTIAL HYSTERECTOMY     TONSILLECTOMY      Past Gynecologic History:  Menarche: 14 Menstrual details: s/p VH  OB History: P0 OB History  No obstetric history on file.    Family History: No h/o female specific cancers Family History  Problem Relation Age of Onset   Breast cancer Neg Hx     Social History: Social History   Socioeconomic History   Marital status: Married    Spouse name: Not on file   Number of children: Not on file   Years of education: Not on file   Highest education level: Not on file  Occupational History   Not on file  Tobacco Use   Smoking status: Former    Current packs/day: 0.00    Types: Cigarettes    Quit date: 1995    Years since quitting: 29.5   Smokeless tobacco: Never  Vaping Use   Vaping status: Never Used  Substance and Sexual Activity   Alcohol use: Yes    Alcohol/week: 1.0 - 2.0 standard drink of alcohol    Types: 1 - 2 Cans of beer per week   Drug use: Not Currently   Sexual activity: Yes    Birth control/protection: None  Other Topics Concern   Not on file  Social History Narrative   Not on file   Social Determinants of Health   Financial  Resource Strain: Not on file  Food Insecurity: Not on file  Transportation Needs: Not on file  Physical Activity: Not on file  Stress: Not on file  Social Connections: Not on file  Intimate Partner Violence: Not on file    Allergies: No Known Allergies  Current Medications: Current Outpatient Medications  Medication Sig Dispense Refill   bictegravir-emtricitabine-tenofovir AF (BIKTARVY) 50-200-25 MG TABS tablet Take by mouth daily.     CALTRATE 600+D3 SOFT 600-800 MG-UNIT CHEW Chew 1 tablet by mouth daily.     cetirizine (ZYRTEC) 10 MG tablet Take 10 mg by mouth daily.     DOVATO 50-300 MG tablet Take by mouth.     famotidine (PEPCID) 20 MG tablet Take 1 tablet (20 mg total) by mouth daily. 30 tablet 0   hydrochlorothiazide (MICROZIDE) 12.5 MG capsule Take 12.5 mg by mouth daily.     HYDROcodone-acetaminophen (NORCO) 5-325 MG tablet Take 1 tablet by mouth 3 (three) times daily as needed for up to 7 days. 21 tablet 0   metFORMIN (GLUCOPHAGE) 500 MG tablet Take 500 mg by mouth daily.     Multiple Vitamin (MULTIVITAMIN ADULT PO) Take by mouth.     rosuvastatin (CRESTOR) 20 MG tablet Take 1 tablet (20 mg total) by mouth daily. 90 tablet 3   No current facility-administered medications for this visit.    Review of Systems General: fatigue; negative for fevers, changes in weight or night sweats Skin: negative for changes in moles or sores or rash Eyes: negative for changes in vision HEENT: negative for change in hearing, tinnitus, voice changes Pulmonary: negative for dyspnea, orthopnea, productive cough, wheezing Cardiac: negative for palpitations, pain Gastrointestinal:  nausea, vomiting, constipation, abdominal distension/discomfort, o/w negative for diarrhea, hematemesis, hematochezia Genitourinary/Sexual: negative for dysuria, retention, hematuria, incontinence Ob/Gyn:  negative for abnormal bleeding Musculoskeletal: negative for pain, joint pain, back pain Hematology: negative  for easy bruising, abnormal bleeding Neurologic/Psych: negative for headaches, seizures, paralysis, weakness, numbness     Objective:  Physical Examination:  BP (!) 117/111   Pulse (!) 129   Temp 97.6 F (36.4 C)   Resp 20   Wt 168 lb 12.8 oz (76.6 kg)   SpO2 100%   BMI 29.90 kg/m    ECOG Performance Status: 2 - Symptomatic, <50% confined to bed  GENERAL: Patient is a ill appearing female in no acute distress but obvious discomfort HEENT:  PERRL, neck supple with midline trachea.  NODES:  No cervical, supraclavicular, axillary, or inguinal lymphadenopathy palpated.  LUNGS:  Clear to auscultation bilaterally.  No wheezes or rhonchi. HEART:  Regular rate and rhythm. No murmur appreciated. ABDOMEN:  Very distended with obvious tense ascites; nontender.   No obvious masses on exam but limited due to ascites. No tympany.  MSK:  No focal spinal tenderness to palpation. Full range of motion bilaterally in the upper extremities. EXTREMITIES:  1+ bilateral peripheral edema.   SKIN:  Clear with no obvious rashes or skin changes. No nail dyscrasia. NEURO:  Nonfocal. Well oriented.  Appropriate affect.  Pelvic: EGBUS: no lesions Cervix: Surgically absent Vagina: no lesions, no discharge or bleeding; cuff intact Uterus: Surgically absent Adnexa: ascites filling the cul de sac. No obvious masses Rectovaginal: checked with patient to confirm not contraindication for RV exam. Findings confirmatory; no masses appreciated.   Lab Review Labs on site today: CA125 pending Lab Results  Component Value Date   WBC 9.2 03/07/2023   HGB 15.4 (H) 03/07/2023   HCT 47.4 (H) 03/07/2023   MCV 93.1 03/07/2023   PLT 354 03/07/2023     Chemistry      Component Value Date/Time   NA 137 03/07/2023 1045   NA 134 (L) 12/06/2012 1211   K 3.5 03/07/2023 1045   K 3.1 (L) 12/06/2012 1211   CL 102 03/07/2023 1045   CL 103 12/06/2012 1211   CO2 23 03/07/2023 1045   CO2 26 12/06/2012 1211   BUN 13  03/07/2023 1045   BUN 10 12/06/2012 1211   CREATININE 1.01 (H) 03/07/2023 1045   CREATININE 0.68 12/06/2012 1211      Component Value Date/Time   CALCIUM 9.1 03/07/2023 1045   CALCIUM 8.7 12/06/2012 1211   ALKPHOS 30 (L) 03/07/2023 1045   ALKPHOS 71 12/06/2012 1211   AST 33 03/07/2023 1045   AST 19 12/06/2012 1211   ALT 16 03/07/2023 1045   ALT 15 12/06/2012 1211   BILITOT 0.9 03/07/2023 1045   BILITOT 0.2 12/06/2012 1211     Albumin: 3.8 Lipase 25  Radiologic Imaging: 03/07/2023 EXAM: CT ABDOMEN AND PELVIS WITH CONTRAST   TECHNIQUE: Multidetector CT imaging of the abdomen and pelvis was performed using the standard protocol following bolus administration of intravenous contrast.   RADIATION DOSE REDUCTION: This exam was performed according to the departmental dose-optimization program which includes automated exposure control, adjustment of the mA and/or kV according to patient size and/or use of iterative reconstruction technique.   CONTRAST:  OMNIPAQUE IOHEXOL 300 MG/ML  SOLN   COMPARISON:  None Available.   FINDINGS: Lower chest: No acute abnormality.   Hepatobiliary: No solid liver abnormality is seen. No gallstones, gallbladder wall thickening, or biliary dilatation.   Pancreas: Unremarkable. No pancreatic ductal dilatation or surrounding inflammatory changes.   Spleen: Normal in size without significant abnormality.   Adrenals/Urinary Tract: Adrenal glands are unremarkable. Kidneys are normal, without renal calculi, solid lesion, or hydronephrosis. Bladder is unremarkable.   Stomach/Bowel: Stomach is within normal limits. Appendix appears normal. No evidence of bowel wall thickening, distention, or inflammatory changes.   Vascular/Lymphatic: Aortic atherosclerosis. No enlarged abdominal or pelvic lymph nodes.   Reproductive: Unusual, large mixed solid and cystic lesion within the central pelvis and low abdomen, with a lobular,  thickened, enhancing contour and multiple enhancing internal solid nodular components. Overall lesion measures at least 16.9 x 14.8 cm (series 2, image 61), largest solid component inferiorly measures 7.7 x 5.2 cm (series 2, image 66, series 6, image 62). Status post hysterectomy. Normal ovarian tissue not confidently visualized.   Other: No abdominal wall hernia or abnormality. Large volume ascites. Mild, diffuse peritoneal thickening and enhancement. Stranding and nodularity of the ventral peritoneum and/or omentum (series 2, image 48).   Musculoskeletal: No acute or significant osseous findings.   IMPRESSION: 1. Unusual, large mixed solid and cystic lesion within the central pelvis and low abdomen, with a lobular, thickened, enhancing contour and multiple enhancing internal solid nodular components. Overall lesion measures at least 16.9 x 14.8 cm, largest solid  component inferiorly measures 7.7 x 5.2 cm. 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 2. Large volume ascites. Mild, diffuse peritoneal thickening and enhancement. Stranding and nodularity of the ventral peritoneum and/or omentum. 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 3. No evidence of lymphadenopathy in the abdomen or pelvis.    CHEST CT FINDINGS: Cardiovascular: Heart size is within normal limits. Small anterior pericardial effusion. Aortic atherosclerosis.   Mediastinum/Nodes: Thyroid gland, trachea, and esophagus are unremarkable. Small hiatal hernia noted. No enlarged mediastinal or axillary lymph nodes.   Lungs/Pleura: Trace pleural fluid identified within the posterior lung bases. Areas of subsegmental atelectasis with volume loss noted within both lower lobes, right middle lobe and inferior lingula. No airspace consolidation identified. Perifissural nodule along the oblique fissure in the left midlung measures 5 mm and most likely represents a  benign intrapulmonary lymph node. No follow-up imaging recommended. No suspicious pulmonary nodule or mass identified bilaterally.   Upper Abdomen: Large volume of abdominal ascites is identified. Deferred to the CT performed earlier today for further details regarding the imaged portions of the upper abdomen.   Musculoskeletal: No chest wall mass or suspicious bone lesions identified.   IMPRESSION: 1. No signs of metastatic disease to the chest. 2. Trace pleural fluid within the posterior lung bases. 3. Areas of subsegmental atelectasis with volume loss noted within both lower lobes, right middle lobe and inferior lingula. 4. Large volume of abdominal ascites. Refer to CT AP report from earlier today. 5. Small hiatal hernia. 6.  Aortic Atherosclerosis (ICD10-I70.0).      Assessment:  Julie Nolan is a 57 y.o. 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   Nausea/vomiting due to tense ascites versus evolving small bowel obstruction.    Elevated creatinine most likely due to dehydration  Elevated Hemoglobin most likely due to dehydration  Hypokalemia due to emesis  HTN, with elevated blood pressure, asymptomatic  HIV, asymptomatic  Medical co-morbidities complicating care: HTN, diabetes, prior abdominal surgery, and immunocompromised. H/o HIV well controlled   Plan:   Problem List Items Addressed This Visit       Cardiovascular and Mediastinum   Hypertension   Other Visit Diagnoses     Pelvic mass in female    -  Primary   Other ascites       Nausea and vomiting, unspecified vomiting type       Elevated serum creatinine       Hypokalemia           We discussed options for management and recommended of admission for further evaluation and care.  Suspect ovarian advanced malignancy with severe ascites limited oral intake with subsequent dehydration, electrolyte abnormalities, and rising creatinine. Elevated BP  because she is unable to tolerate her antihypertensive medications. She may benefit from hospital admission, IV fluids, antiemetics and administration of her medications as well as therapeutic and diagnostic paracentesis.  Send fluid for cytology. Discussed with Dr. Cathie Hoops, Medical Oncology who is on call today.   Hypertension recheck blood pressure now 127/100 an pulse is 118. Stable for transport to the ER.    Suggested return to clinic in  1-2 weeks.    The patient's diagnosis, an outline of the further diagnostic and laboratory studies which will be required, the recommendation, and alternatives were discussed.  All questions were answered to the patient's satisfaction.  A total of 60 minutes were spent with the patient/family today; >50% was spent in education,  counseling and coordination of care for  suspected ovarian advanced malignancy with severe ascites limited oral intake with subsequent dehydration, electrolyte abnormalities, HTN, and rising creatinine. The patient agrees with this plan. LOS based on medical decision making.   Eathen Budreau Leta Jungling, MD

## 2023-03-08 NOTE — Assessment & Plan Note (Signed)
-   Continue home regimen 

## 2023-03-08 NOTE — Telephone Encounter (Signed)
Spoke with Julie Nolan on 03/07/23 while she was in the ED. Appointment arranged for gyn oncology consult 03/08/23. Provided directions to the cancer center and went over what to expect during consult.

## 2023-03-08 NOTE — H&P (Addendum)
History and Physical    PatientMarland Kitchen Julie Nolan:[id number] DOB: [birth year] DOA: 03/08/2023 DOS: the patient was seen and examined on 03/08/2023 PCP: Jodi Marble, NP  Patient coming from: Home  Chief Complaint:  Chief Complaint  Patient presents with   Abdominal Pain   HPI: Julie Nolan is a 57 y.o. female with medical history significant of newly diagnosed large pelvic mass of unknown primary with malignant ascites, type 2 diabetes, HIV, hypertension, hyperlipidemia, who presents to the ED due to abdominal pain.  [name] states that for the last 2 days, she has been trying to eat and drink but has been unable to hold anything down.  She describes getting full very quickly.  Otherwise, she denies any fever, chills, chest pain, shortness of breath.  She denies noticing any leg swelling, but she was told yesterday she did have some.  She endorses abdominal pain, but states it predominantly occurs when moving around not when resting.  Per chart review, patient presented to the ED yesterday with complaints of abdominal distention, pain, nausea and weight gain.  CT imaging obtained at that time demonstrated a large mass measuring 16.9 x 14.8 cm with malignant ascites and peritoneal carcinomatosis.  She was discharged home with appointments arranged for today with gynecologic oncology and medical oncology.  ED course: On arrival to the ED, patient was normotensive at 129/99 with heart rate of 113.  She was saturating at 99% on room air.  She was afebrile at 98.3.  Workup demonstrated WBC of 12.8, sodium of 131, glucose 157, creatinine 1.09, alkaline phosphatase 32 and GFR of 60.  Urinalysis with moderate hematuria, ketonuria, proteinuria, increase specific gravity, few bacteria.  Patient started on IV fluids and Zofran.  Per EDP, oncology is requesting patient be admitted.  [facility] contacted for admission.  Review of Systems: As mentioned in the history of present illness. All other systems  reviewed and are negative.  Past Medical History:  Diagnosis Date   Diabetes mellitus without complication (HCC)    HIV (human immunodeficiency virus infection) (HCC)    Hyperlipidemia    Hypertension    Past Surgical History:  Procedure Laterality Date   COLONOSCOPY WITH PROPOFOL N/A 05/29/2020   Procedure: COLONOSCOPY WITH PROPOFOL;  Surgeon: Midge Minium, MD;  Location: [facility];  Service: Endoscopy;  Laterality: N/A;   PARTIAL HYSTERECTOMY     TONSILLECTOMY     Social History:  reports that she quit smoking about 29 years ago. Her smoking use included cigarettes. She has never used smokeless tobacco. She reports current alcohol use of about 1.0 - 2.0 standard drink of alcohol per week. She reports that she does not currently use drugs.  No Known Allergies  Family History  Problem Relation Age of Onset   Breast cancer Neg Hx     Prior to Admission medications   Medication Sig Start Date End Date Taking? Authorizing Provider  bictegravir-emtricitabine-tenofovir AF (BIKTARVY) 50-200-25 MG TABS tablet Take by mouth daily.    [provider]  CALTRATE 600+D3 SOFT 600-800 MG-UNIT CHEW Chew 1 tablet by mouth daily. 04/23/20   [provider]  cetirizine (ZYRTEC) 10 MG tablet Take 10 mg by mouth daily. 09/29/22   [provider]  DOVATO 50-300 MG tablet Take by mouth. 09/29/22   [provider]  famotidine (PEPCID) 20 MG tablet Take 1 tablet (20 mg total) by mouth daily. 03/07/23 04/06/23  Menshew, Charlesetta Ivory, PA-C  hydrochlorothiazide (MICROZIDE) 12.5 MG capsule Take 12.5  mg by mouth daily.    [provider]  HYDROcodone-acetaminophen (NORCO) 5-325 MG tablet Take 1 tablet by mouth 3 (three) times daily as needed for up to 7 days. 03/07/23 03/14/23  Menshew, Charlesetta Ivory, PA-C  metFORMIN (GLUCOPHAGE) 500 MG tablet Take 500 mg by mouth daily. 04/28/20   [provider]  Multiple Vitamin (MULTIVITAMIN ADULT PO) Take by mouth.     [provider]  rosuvastatin (CRESTOR) 20 MG tablet Take 1 tablet (20 mg total) by mouth daily. 02/24/21 03/08/23  Debbe Odea, MD    Physical Exam: Vitals:   03/08/23 1210 03/08/23 1747 03/08/23 1748 03/08/23 1900  BP: (!) 129/95 (!) 129/99  (!) 125/97  Pulse: (!) 127  (!) 113 (!) 125  Resp: 18   18  Temp: 98.3 F (36.8 C)   98.3 F (36.8 C)  TempSrc:    Oral  SpO2: 98%  99% 100%  Weight: 76.2 kg     Height: 5\' 3"  (1.6 m)      Physical Exam Vitals and nursing note reviewed.  Constitutional:      General: She is not in acute distress.    Appearance: She is not toxic-appearing.  HENT:     Head: Normocephalic and atraumatic.  Cardiovascular:     Rate and Rhythm: Regular rhythm. Tachycardia present.  Pulmonary:     Effort: Pulmonary effort is normal. No respiratory distress.     Breath sounds: Normal breath sounds.  Abdominal:     General: Bowel sounds are decreased. There is distension (markedly).     Palpations: Abdomen is rigid.     Tenderness: There is no abdominal tenderness. There is no guarding.     Hernia: No hernia is present.  Musculoskeletal:     Comments: Trivial bilateral pitting edema of the lower extremities  Skin:    General: Skin is warm and dry.  Neurological:     General: No focal deficit present.     Mental Status: She is alert and oriented to person, place, and time.  Psychiatric:        Mood and Affect: Mood normal.        Behavior: Behavior normal.    Data Reviewed: CBC with WBC of 12.8, hemoglobin 15.0, and platelets of 330 CMP with sodium of 131, potassium 3.8, bicarb 25, glucose 157, BUN 19, creatinine 1.09, calcium 8.6, alkaline phosphatase 32, AST 39, ALT 16, GFR of 60. Urinalysis with moderate hematuria, ketonuria, proteinuria, increased Pacific gravity with a few bacteria  CT Abdomen/Pelvis obtained yesterday:  1. Unusual, large mixed solid and cystic lesion within the central pelvis and low abdomen, with a lobular,  thickened, enhancing contour and multiple enhancing internal solid nodular components. Overall lesion measures at least 16.9 x 14.8 cm, largest solid component inferiorly measures 7.7 x 5.2 cm. 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 2. Large volume ascites. Mild, diffuse peritoneal thickening and enhancement. Stranding and nodularity of the ventral peritoneum and/or omentum. 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 3. No evidence of lymphadenopathy in the abdomen or pelvis.  Results are pending, will review when available.  Assessment and Plan:  * Nausea and vomiting Patient is presenting with 2-day history of nausea and vomiting leading to poor p.o. intake and hypovolemia.  EKG demonstrates sinus tachycardia.  I suspect symptoms may be due to large volume ascites leading to gastric compression.  - Supportive management with IV fluids and Zofran  Malignant ascites Patient presented yesterday with  2 month history of gastric distention found to have large volume ascites, suspected to be malignant given very large pelvic mass and peritoneal carcinomatosis.  Given inability to tolerate p.o. intake, will pursue inpatient paracentesis.  Although SBP is low on the differential, with a new leukocytosis and tachycardia, will cover with Rocephin until paracentesis results.  - Ultrasound-guided therapeutic and diagnostic paracentesis ordered - Ceftriaxone 2 g daily  Pelvic mass Recently is covered very large pelvic mass.  Follow-up with gynecological oncology today, suspected to be advanced ovarian malignancy.  HIV (human immunodeficiency virus infection) (HCC) - Continue home regimen  Hypertension - Hold home antihypertensives given risk for hypotension in the setting of hypovolemia and upcoming paracentesis  Type 2 diabetes mellitus (HCC) - Hold home metformin - A1c pending - SSI, sensitive  Advance Care Planning:   Code  Status: Full Code verified by patient  Consults: IR  Family Communication: No family at bedside  Severity of Illness: The appropriate patient status for this patient is OBSERVATION. Observation status is judged to be reasonable and necessary in order to provide the required intensity of service to ensure the patient's safety. The patient's presenting symptoms, physical exam findings, and initial radiographic and laboratory data in the context of their medical condition is felt to place them at decreased risk for further clinical deterioration. Furthermore, it is anticipated that the patient will be medically stable for discharge from the hospital within 2 midnights of admission.   Author: Verdene Lennert, MD 03/08/2023 7:49 PM  For on call review [email or web address].

## 2023-03-08 NOTE — Assessment & Plan Note (Signed)
Recently is covered very large pelvic mass.  Follow-up with gynecological oncology today, suspected to be advanced ovarian malignancy.

## 2023-03-08 NOTE — ED Provider Notes (Signed)
[facility] Provider Note    Event Date/Time   First MD Initiated Contact with Patient 03/08/23 1629     (approximate)   History   Abdominal Pain   HPI  Julie Nolan is a 57 y.o. 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  She has had poor p.o. intake due to nausea.  She has had no vomiting or diarrhea.  She has had no fever.  She denies urinary symptoms.  She has not started treatment for cancer yet.  She was seen in emergency department yesterday and got a CT scan showing a large complex pelvic mass and had gynecology consultation and outpatient follow-up arranged.   External Medical Documents Reviewed: Gynecology consultation note from yesterday regarding her mass and plan for outpatient follow-up.      Physical Exam   Triage Vital Signs: ED Triage Vitals [03/08/23 1210]  Encounter Vitals Group     BP (!) 129/95     Systolic BP Percentile      Diastolic BP Percentile      Pulse Rate (!) 127     Resp 18     Temp 98.3 F (36.8 C)     Temp src      SpO2 98 %     Weight 168 lb (76.2 kg)     Height 5\' 3"  (1.6 m)     Head Circumference      Peak Flow      Pain Score 3     Pain Loc      Pain Education      Exclude from Growth Chart     Most recent vital signs: Vitals:   03/08/23 1747 03/08/23 1748  BP: (!) 129/99   Pulse:  (!) 113  Resp:    Temp:    SpO2:  99%    General: Awake, no distress.  CV:  Good peripheral perfusion.  Resp:  Normal effort.  Other:  Abdominal distention.  Nontender to deep palpation all quadrants.   ED Results / Procedures / Treatments   Labs (all labs ordered are listed, but only abnormal results are displayed) Labs Reviewed  COMPREHENSIVE METABOLIC PANEL - Abnormal; Notable for the following  components:      Result Value   Sodium 131 (*)    Glucose, Bld 157 (*)    Creatinine, Ser 1.09 (*)    Calcium 8.6 (*)    Alkaline Phosphatase 32 (*)    GFR, Estimated 60 (*)    All other components within normal limits  CBC - Abnormal; Notable for the following components:   WBC 12.8 (*)    All other components within normal limits  URINALYSIS, ROUTINE W REFLEX MICROSCOPIC - Abnormal; Notable for the following components:   Color, Urine AMBER (*)    APPearance CLOUDY (*)    Specific Gravity, Urine >1.046 (*)    Hgb urine dipstick MODERATE (*)    Ketones, ur 5 (*)    Protein, ur >=300 (*)    Bacteria, UA FEW (*)    All other components within normal limits  LIPASE, BLOOD     I ordered and reviewed the above labs they are notable for she has a creatinine of 1.09, increased from prior testing, white blood cell count is mildly elevated 12.8.  PROCEDURES:  Critical Care performed: No  Procedures   MEDICATIONS ORDERED IN ED: Medications  ondansetron (ZOFRAN-ODT) disintegrating tablet 4 mg (4 mg Oral Given 03/08/23 1700)  sodium chloride 0.9 % bolus 1,000 mL (1,000 mLs Intravenous New Bag/Given 03/08/23 1748)    External physician / consultants:  I spoke with interventional radiologist Dr. Elby Showers regarding care plan for this patient.   IMPRESSION / MDM / ASSESSMENT AND PLAN / ED COURSE  I reviewed the triage vital signs and the nursing notes.                                Patient's presentation is most consistent with acute presentation with potential threat to life or bodily function.  Differential diagnosis includes, but is not limited to, malignant ascites, SBP, dehydration electrolyte disturbance, bowel obstruction   The patient is on the cardiac monitor to evaluate for evidence of arrhythmia and/or significant heart rate changes.  MDM: Patient with tense ascites malignant but no respiratory symptoms, looks comfortable and nontoxic with a soft benign abdomen  no fever so I doubt SBP.  I doubt bowel obstruction given passing flatus and CT scan was just performed yesterday with no indication of any degree of bowel obstruction.  I think her symptoms are related to her ascites which I agree needs paracentesis.  I do not think she needs it emergently given her stability.    She appears slightly dehydrated with an elevated creatinine and slightly tachycardic so I will give her an IV liter of crystalloid.  Will give antiemetic as well.  --- I spoke with her oncologist Dr. Cathie Hoops over the telephone.  Given her dehydration and inability to tolerate p.o., with rising creatinine, Dr. Cathie Hoops advised admission overnight for hydration and paracentesis tomorrow.  Patient is agreeable.  I have consulted with hospitalist for admission.     FINAL CLINICAL IMPRESSION(S) / ED DIAGNOSES   Final diagnoses:  Malignant ascites  Abdominal distension  Dehydration     Rx / DC Orders   ED Discharge Orders     None        Note:  This document was prepared using Dragon voice recognition software and may include unintentional dictation errors.    Pilar Jarvis, MD 03/08/23 [phone]

## 2023-03-08 NOTE — ED Triage Notes (Signed)
Sent over from the cancer center for elevated BP, abdominal pain and vomiting. Was seen yesterday for the same.

## 2023-03-09 ENCOUNTER — Inpatient Hospital Stay: Payer: BC Managed Care – PPO

## 2023-03-09 DIAGNOSIS — K219 Gastro-esophageal reflux disease without esophagitis: Secondary | ICD-10-CM | POA: Diagnosis present

## 2023-03-09 DIAGNOSIS — R824 Acetonuria: Secondary | ICD-10-CM | POA: Diagnosis present

## 2023-03-09 DIAGNOSIS — R112 Nausea with vomiting, unspecified: Secondary | ICD-10-CM | POA: Diagnosis not present

## 2023-03-09 DIAGNOSIS — K5900 Constipation, unspecified: Secondary | ICD-10-CM | POA: Diagnosis present

## 2023-03-09 DIAGNOSIS — C569 Malignant neoplasm of unspecified ovary: Secondary | ICD-10-CM | POA: Diagnosis present

## 2023-03-09 DIAGNOSIS — R109 Unspecified abdominal pain: Secondary | ICD-10-CM | POA: Diagnosis present

## 2023-03-09 DIAGNOSIS — R180 Malignant ascites: Secondary | ICD-10-CM

## 2023-03-09 DIAGNOSIS — B20 Human immunodeficiency virus [HIV] disease: Secondary | ICD-10-CM | POA: Diagnosis present

## 2023-03-09 DIAGNOSIS — R809 Proteinuria, unspecified: Secondary | ICD-10-CM | POA: Diagnosis present

## 2023-03-09 DIAGNOSIS — E785 Hyperlipidemia, unspecified: Secondary | ICD-10-CM | POA: Diagnosis present

## 2023-03-09 DIAGNOSIS — R000 Tachycardia, unspecified: Secondary | ICD-10-CM | POA: Diagnosis present

## 2023-03-09 DIAGNOSIS — K449 Diaphragmatic hernia without obstruction or gangrene: Secondary | ICD-10-CM | POA: Diagnosis present

## 2023-03-09 DIAGNOSIS — E860 Dehydration: Secondary | ICD-10-CM | POA: Diagnosis present

## 2023-03-09 DIAGNOSIS — Z90711 Acquired absence of uterus with remaining cervical stump: Secondary | ICD-10-CM | POA: Diagnosis not present

## 2023-03-09 DIAGNOSIS — E876 Hypokalemia: Secondary | ICD-10-CM | POA: Diagnosis present

## 2023-03-09 DIAGNOSIS — I700 Atherosclerosis of aorta: Secondary | ICD-10-CM | POA: Diagnosis present

## 2023-03-09 DIAGNOSIS — J9811 Atelectasis: Secondary | ICD-10-CM | POA: Diagnosis present

## 2023-03-09 DIAGNOSIS — R319 Hematuria, unspecified: Secondary | ICD-10-CM | POA: Diagnosis present

## 2023-03-09 DIAGNOSIS — C786 Secondary malignant neoplasm of retroperitoneum and peritoneum: Secondary | ICD-10-CM | POA: Diagnosis present

## 2023-03-09 DIAGNOSIS — Z7984 Long term (current) use of oral hypoglycemic drugs: Secondary | ICD-10-CM | POA: Diagnosis not present

## 2023-03-09 DIAGNOSIS — E119 Type 2 diabetes mellitus without complications: Secondary | ICD-10-CM | POA: Diagnosis present

## 2023-03-09 DIAGNOSIS — I10 Essential (primary) hypertension: Secondary | ICD-10-CM | POA: Diagnosis present

## 2023-03-09 DIAGNOSIS — M8580 Other specified disorders of bone density and structure, unspecified site: Secondary | ICD-10-CM | POA: Diagnosis present

## 2023-03-09 DIAGNOSIS — K652 Spontaneous bacterial peritonitis: Secondary | ICD-10-CM | POA: Diagnosis present

## 2023-03-09 DIAGNOSIS — R1900 Intra-abdominal and pelvic swelling, mass and lump, unspecified site: Secondary | ICD-10-CM | POA: Diagnosis not present

## 2023-03-09 DIAGNOSIS — Z87891 Personal history of nicotine dependence: Secondary | ICD-10-CM | POA: Diagnosis not present

## 2023-03-09 LAB — CBC WITH DIFFERENTIAL/PLATELET
Abs Immature Granulocytes: 0.08 10*3/uL — ABNORMAL HIGH (ref 0.00–0.07)
Basophils Absolute: 0 10*3/uL (ref 0.0–0.1)
Basophils Relative: 0 %
Eosinophils Absolute: 0 10*3/uL (ref 0.0–0.5)
Eosinophils Relative: 0 %
HCT: 39.2 % (ref 36.0–46.0)
Hemoglobin: 13.3 g/dL (ref 12.0–15.0)
Immature Granulocytes: 1 %
Lymphocytes Relative: 10 %
Lymphs Abs: 1.2 10*3/uL (ref 0.7–4.0)
MCH: 31.2 pg (ref 26.0–34.0)
MCHC: 33.9 g/dL (ref 30.0–36.0)
MCV: 92 fL (ref 80.0–100.0)
Monocytes Absolute: 0.3 10*3/uL (ref 0.1–1.0)
Monocytes Relative: 2 %
Neutro Abs: 11.1 10*3/uL — ABNORMAL HIGH (ref 1.7–7.7)
Neutrophils Relative %: 87 %
Platelets: 306 10*3/uL (ref 150–400)
RBC: 4.26 MIL/uL (ref 3.87–5.11)
RDW: 13.8 % (ref 11.5–15.5)
WBC: 12.8 10*3/uL — ABNORMAL HIGH (ref 4.0–10.5)
nRBC: 0 % (ref 0.0–0.2)

## 2023-03-09 LAB — BODY FLUID CELL COUNT WITH DIFFERENTIAL
Eos, Fluid: 2 %
Lymphs, Fluid: 3 %
Monocyte-Macrophage-Serous Fluid: 14 %
Neutrophil Count, Fluid: 81 %
Total Nucleated Cell Count, Fluid: 7787 uL

## 2023-03-09 LAB — BASIC METABOLIC PANEL
Anion gap: 8 (ref 5–15)
BUN: 21 mg/dL — ABNORMAL HIGH (ref 6–20)
CO2: 25 mmol/L (ref 22–32)
Calcium: 8.2 mg/dL — ABNORMAL LOW (ref 8.9–10.3)
Chloride: 97 mmol/L — ABNORMAL LOW (ref 98–111)
Creatinine, Ser: 1.04 mg/dL — ABNORMAL HIGH (ref 0.44–1.00)
GFR, Estimated: >60 mL/min (ref >60)
Glucose, Bld: 149 mg/dL — ABNORMAL HIGH (ref 70–99)
Potassium: 3.2 mmol/L — ABNORMAL LOW (ref 3.5–5.1)
Sodium: 130 mmol/L — ABNORMAL LOW (ref 135–145)

## 2023-03-09 LAB — PHOSPHORUS: Phosphorus: 2.4 mg/dL — ABNORMAL LOW (ref 2.5–4.6)

## 2023-03-09 LAB — GLUCOSE, CAPILLARY
Glucose-Capillary: 117 mg/dL — ABNORMAL HIGH (ref 70–99)
Glucose-Capillary: 136 mg/dL — ABNORMAL HIGH (ref 70–99)
Glucose-Capillary: 147 mg/dL — ABNORMAL HIGH (ref 70–99)
Glucose-Capillary: 115 mg/dL — ABNORMAL HIGH (ref 70–99)

## 2023-03-09 LAB — ALBUMIN, PLEURAL OR PERITONEAL FLUID: Albumin, Fluid: 2.4 g/dL

## 2023-03-09 LAB — MAGNESIUM: Magnesium: 2.5 mg/dL — ABNORMAL HIGH (ref 1.7–2.4)

## 2023-03-09 MED ORDER — PANTOPRAZOLE SODIUM 40 MG PO TBEC
40.0000 mg | DELAYED_RELEASE_TABLET | Freq: Every day | ORAL | Status: DC
  Administered 2023-03-09 – 2023-03-12 (×4): 40 mg via ORAL
  Filled 2023-03-09 (×4): qty 1

## 2023-03-09 MED ORDER — SODIUM CHLORIDE 0.9 % IV SOLN
12.5000 mg | Freq: Four times a day (QID) | INTRAVENOUS | Status: DC | PRN
  Administered 2023-03-09: 12.5 mg via INTRAVENOUS
  Filled 2023-03-09: qty 12.5

## 2023-03-09 MED ORDER — LIDOCAINE HCL (PF) 1 % IJ SOLN
10.0000 mL | Freq: Once | INTRAMUSCULAR | Status: AC
  Administered 2023-03-09: 10 mL via INTRADERMAL
  Filled 2023-03-09: qty 10

## 2023-03-09 MED ORDER — POTASSIUM CHLORIDE CRYS ER 20 MEQ PO TBCR
40.0000 meq | EXTENDED_RELEASE_TABLET | ORAL | Status: AC
  Administered 2023-03-09 (×2): 40 meq via ORAL
  Filled 2023-03-09 (×2): qty 2

## 2023-03-09 NOTE — Progress Notes (Signed)
Patient alert and oriented, complained of pain, PRN pain meds given x1 with improvement. Very nauseated this shift, hospitalist made aware, phenergan IVPB given with improvement, will continue to monitor.

## 2023-03-09 NOTE — Procedures (Signed)
PROCEDURE SUMMARY:  Successful US guided paracentesis from RUQ. Yielded 6.1 L of dark amber/brown colored fluid.  No immediate complications.  Pt tolerated well.   Specimen was sent for labs.  EBL < 5mL  Cloretta Ned 03/09/2023 3:49 PM

## 2023-03-09 NOTE — TOC CM/SW Note (Signed)
Transition of Care [facility]) - Inpatient Brief Assessment   Patient Details  Name: Julie Nolan MRN: [id number] Date of Birth: Nov 17, 1965  [facility] CM/SW Contact:    Allena Katz, LCSW Phone Number: 03/09/2023, 10:46 AM   Clinical Narrative:    Transition of Care Asessment: Insurance and Status: Insurance coverage has been reviewed Patient has primary care physician: Yes Home environment has been reviewed: [address] [place] [id number]- Prior level of function:: independent Prior/Current Home Services: No current home services Social Determinants of Health Reivew: SDOH reviewed no interventions necessary Readmission risk has been reviewed: Yes Transition of care needs: no transition of care needs at this time

## 2023-03-09 NOTE — Plan of Care (Signed)
  Problem: Education: Goal: Knowledge of General Education information will improve Description: Including pain rating scale, medication(s)/side effects and non-pharmacologic comfort measures Outcome: Progressing   Problem: Health Behavior/Discharge Planning: Goal: Ability to manage health-related needs will improve Outcome: Progressing   Problem: Clinical Measurements: Goal: Ability to maintain clinical measurements within normal limits will improve Outcome: Progressing Goal: Will remain free from infection Outcome: Progressing Goal: Diagnostic test results will improve Outcome: Progressing Goal: Respiratory complications will improve Outcome: Progressing Goal: Cardiovascular complication will be avoided Outcome: Progressing   Problem: Activity: Goal: Risk for activity intolerance will decrease Outcome: Progressing   Problem: Nutrition: Goal: Adequate nutrition will be maintained Outcome: Progressing   Problem: Coping: Goal: Level of anxiety will decrease Outcome: Progressing   Problem: Elimination: Goal: Will not experience complications related to bowel motility Outcome: Progressing Goal: Will not experience complications related to urinary retention Outcome: Progressing   Problem: Pain Managment: Goal: General experience of comfort will improve Outcome: Progressing   Problem: Safety: Goal: Ability to remain free from injury will improve Outcome: Progressing   Problem: Skin Integrity: Goal: Risk for impaired skin integrity will decrease Outcome: Progressing   Problem: Education: Goal: Ability to describe self-care measures that may prevent or decrease complications (Diabetes Survival Skills Education) will improve Outcome: Progressing Goal: Individualized Educational Video(s) Outcome: Progressing   Problem: Coping: Goal: Ability to adjust to condition or change in health will improve Outcome: Progressing   Problem: Fluid Volume: Goal: Ability to  maintain a balanced intake and output will improve Outcome: Progressing   Problem: Health Behavior/Discharge Planning: Goal: Ability to identify and utilize available resources and services will improve Outcome: Progressing Goal: Ability to manage health-related needs will improve Outcome: Progressing

## 2023-03-09 NOTE — Progress Notes (Signed)
Progress Note    Julie Nolan  WGN:[id number] DOB: August 06, 1966  DOA: 03/08/2023 PCP: Jodi Marble, NP      Brief Narrative:    Medical records reviewed and are as summarized below:  Julie Nolan is a 57 y.o. 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  She was seen by a gynecologist, Dr. Sonia Side, for newly diagnosed large pelvic mass on 03/08/2023 when she was referred to the emergency department for further management because of the aforementioned symptoms.        Assessment/Plan:   Principal Problem:   Nausea and vomiting Active Problems:   Malignant ascites   Pelvic mass   HIV (human immunodeficiency virus infection) (HCC)   Hypertension   Type 2 diabetes mellitus (HCC)    Body mass index is 29.76 kg/m.   Nausea and vomiting Improved.  Use antiemetics as needed.     Hypokalemia Replete potassium and monitor levels   Malignant ascites Patient presented yesterday with 2 month history of gastric distention found to have large volume ascites, suspected to be malignant given very large pelvic mass and peritoneal carcinomatosis.  Plan for paracentesis today and ascitic fluid analysis Continue IV ceftriaxone for now pending ascitic fluid analysis.   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  Outpatient follow-up with gynecologist and oncologist.    HIV (human immunodeficiency virus infection) (HCC) Continue dolutegravir and lamivudine    Hypertension HCTZ on hold    Type 2 diabetes mellitus (HCC) Metformin on hold.  Use NovoLog as needed for hyperglycemia.      Diet Order             Diet regular Room service appropriate? Yes; Fluid consistency: Thin  Diet  effective now                            Consultants: None  Procedures: Plan for paracentesis    Medications:    dolutegravir  50 mg Oral Daily   And   lamiVUDine  300 mg Oral Daily   insulin aspart  0-9 Units Subcutaneous TID WC   pantoprazole  40 mg Oral Daily   potassium chloride  40 mEq Oral Q4H   rosuvastatin  20 mg Oral Daily   sodium chloride flush  3 mL Intravenous Q12H   Continuous Infusions:  cefTRIAXone (ROCEPHIN)  IV Stopped (03/08/23 2000)   promethazine (PHENERGAN) injection (IM or IVPB) Stopped (03/09/23 0404)     Anti-infectives (From admission, onward)    Start     Dose/Rate Route Frequency Ordered Stop   03/09/23 1000  dolutegravir-lamiVUDine (DOVATO) 50-300 MG per tablet 1 tablet  Status:  Discontinued        1 tablet Oral Daily 03/08/23 1951 03/08/23 2002   03/09/23 1000  dolutegravir (TIVICAY) tablet 50 mg       Placed in "And" Linked Group   50 mg Oral Daily 03/08/23 2002     03/09/23 1000  lamiVUDine (EPIVIR) tablet 300 mg       Placed in "And" Linked Group   300 mg Oral Daily 03/08/23 2002     03/08/23 1900  cefTRIAXone (ROCEPHIN) 2 g in sodium chloride 0.9 % 100 mL IVPB  2 g 200 mL/hr over 30 Minutes Intravenous Every 24 hours 03/08/23 1839                Family Communication/Anticipated D/C date and plan/Code Status   DVT prophylaxis: SCDs Start: 03/08/23 1838     Code Status: Full Code  Family Communication: None Disposition Plan: Plan to discharge home in 1 to 2 days   Status is: Observation The patient will require care spanning > 2 midnights and should be moved to inpatient because: Correct hypokalemia, he is paracentesis for ascitic fluid analysis       Subjective:   Interval events noted.  She complains of abdominal distention.  Abdominal pain, nausea and vomiting have improved.  Last bowel movement was on Monday, 03/06/2023.  Objective:    Vitals:   03/08/23 1900 03/08/23 1955 03/09/23  0438 03/09/23 0733  BP: (!) 125/97 107/87 (!) 130/103 (!) 138/102  Pulse: (!) 125 (!) 104 (!) 108 (!) 102  Resp: 18 18 19 16   Temp: 98.3 F (36.8 C) 98.7 F (37.1 C) (!) 97.3 F (36.3 C) 98 F (36.7 C)  TempSrc: Oral  Oral   SpO2: 100% 94% 95% 100%  Weight:      Height:       No data found.   Intake/Output Summary (Last 24 hours) at 03/09/2023 1200 Last data filed at 03/09/2023 0431 Gross per 24 hour  Intake 1101.56 ml  Output --  Net 1101.56 ml   Filed Weights   03/08/23 1210  Weight: 76.2 kg    Exam:  GEN: NAD SKIN: Warm and dry EYES: EOMI ENT: MMM CV: RRR PULM: CTA B ABD: soft, distended, NT, +BS CNS: AAO x 3, non focal EXT: No edema or tenderness        Data Reviewed:   I have personally reviewed following labs and imaging studies:  Labs: Labs show the following:   Basic Metabolic Panel: Recent Labs  Lab 03/07/23 1045 03/08/23 1213 03/09/23 0427  NA 137 131* 130*  K 3.5 3.8 3.2*  CL 102 99 97*  CO2 23 25 25   GLUCOSE 146* 157* 149*  BUN 13 19 21*  CREATININE 1.01* 1.09* 1.04*  CALCIUM 9.1 8.6* 8.2*  MG  --   --  2.5*  PHOS  --   --  2.4*   GFR Estimated Creatinine Clearance: 59.7 mL/min (A) (by C-G formula based on SCr of 1.04 mg/dL (H)). Liver Function Tests: Recent Labs  Lab 03/07/23 1045 03/08/23 1213  AST 33 39  ALT 16 16  ALKPHOS 30* 32*  BILITOT 0.9 0.8  PROT 7.4 7.1  ALBUMIN 3.8 3.5   Recent Labs  Lab 03/07/23 1045 03/08/23 1213  LIPASE 25 24   No results for input(s): "AMMONIA" in the last 168 hours. Coagulation profile No results for input(s): "INR", "PROTIME" in the last 168 hours.  CBC: Recent Labs  Lab 03/07/23 1045 03/08/23 1213 03/09/23 0427  WBC 9.2 12.8* 12.8*  NEUTROABS  --   --  11.1*  HGB 15.4* 15.0 13.3  HCT 47.4* 45.1 39.2  MCV 93.1 91.9 92.0  PLT 354 330 306   Cardiac Enzymes: No results for input(s): "CKTOTAL", "CKMB", "CKMBINDEX", "TROPONINI" in the last 168 hours. BNP (last 3  results) No results for input(s): "PROBNP" in the last 8760 hours. CBG: Recent Labs  Lab 03/08/23 2204 03/09/23 0735  GLUCAP 160* 117*   D-Dimer: No results for input(s): "DDIMER" in the last 72 hours. Hgb A1c: No results for  input(s): "HGBA1C" in the last 72 hours. Lipid Profile: No results for input(s): "CHOL", "HDL", "LDLCALC", "TRIG", "CHOLHDL", "LDLDIRECT" in the last 72 hours. Thyroid function studies: No results for input(s): "TSH", "T4TOTAL", "T3FREE", "THYROIDAB" in the last 72 hours.  Invalid input(s): "FREET3" Anemia work up: No results for input(s): "VITAMINB12", "FOLATE", "FERRITIN", "TIBC", "IRON", "RETICCTPCT" in the last 72 hours. Sepsis Labs: Recent Labs  Lab 03/07/23 1045 03/08/23 1213 03/09/23 0427  WBC 9.2 12.8* 12.8*    Microbiology No results found for this or any previous visit (from the past 240 hour(s)).  Procedures and diagnostic studies:  CT CHEST WO CONTRAST  Result Date: 03/07/2023 CLINICAL DATA:  Evaluate for metastatic disease. Newly diagnosed ovarian cancer. * Tracking Code: BO *. EXAM: CT CHEST WITHOUT CONTRAST TECHNIQUE: Multidetector CT imaging of the chest was performed following the standard protocol without IV contrast. RADIATION DOSE REDUCTION: This exam was performed according to the departmental dose-optimization program which includes automated exposure control, adjustment of the mA and/or kV according to patient size and/or use of iterative reconstruction technique. COMPARISON:  None FINDINGS: Cardiovascular: Heart size is within normal limits. Small anterior pericardial effusion. Aortic atherosclerosis. Mediastinum/Nodes: Thyroid gland, trachea, and esophagus are unremarkable. Small hiatal hernia noted. No enlarged mediastinal or axillary lymph nodes. Lungs/Pleura: Trace pleural fluid identified within the posterior lung bases. Areas of subsegmental atelectasis with volume loss noted within both lower lobes, right middle lobe and  inferior lingula. No airspace consolidation identified. Perifissural nodule along the oblique fissure in the left midlung measures 5 mm and most likely represents a benign intrapulmonary lymph node. No follow-up imaging recommended. No suspicious pulmonary nodule or mass identified bilaterally. Upper Abdomen: Large volume of abdominal ascites is identified. Deferred to the CT performed earlier today for further details regarding the imaged portions of the upper abdomen. Musculoskeletal: No chest wall mass or suspicious bone lesions identified. IMPRESSION: 1. No signs of metastatic disease to the chest. 2. Trace pleural fluid within the posterior lung bases. 3. Areas of subsegmental atelectasis with volume loss noted within both lower lobes, right middle lobe and inferior lingula. 4. Large volume of abdominal ascites. Refer to CT AP report from earlier today. 5. Small hiatal hernia. 6.  Aortic Atherosclerosis (ICD10-I70.0). Electronically Signed   By: Signa Kell M.D.   On: 03/07/2023 15:58   CT ABDOMEN PELVIS W CONTRAST  Result Date: 03/07/2023 CLINICAL DATA:  Abdominal pain for 2 months, HIV with recent medication change * Tracking Code: BO * EXAM: CT ABDOMEN AND PELVIS WITH CONTRAST TECHNIQUE: Multidetector CT imaging of the abdomen and pelvis was performed using the standard protocol following bolus administration of intravenous contrast. RADIATION DOSE REDUCTION: This exam was performed according to the departmental dose-optimization program which includes automated exposure control, adjustment of the mA and/or kV according to patient size and/or use of iterative reconstruction technique. CONTRAST:  OMNIPAQUE IOHEXOL 300 MG/ML  SOLN COMPARISON:  None Available. FINDINGS: Lower chest: No acute abnormality. Hepatobiliary: No solid liver abnormality is seen. No gallstones, gallbladder wall thickening, or biliary dilatation. Pancreas: Unremarkable. No pancreatic ductal dilatation or surrounding  inflammatory changes. Spleen: Normal in size without significant abnormality. Adrenals/Urinary Tract: Adrenal glands are unremarkable. Kidneys are normal, without renal calculi, solid lesion, or hydronephrosis. Bladder is unremarkable. Stomach/Bowel: Stomach is within normal limits. Appendix appears normal. No evidence of bowel wall thickening, distention, or inflammatory changes. Vascular/Lymphatic: Aortic atherosclerosis. No enlarged abdominal or pelvic lymph nodes. Reproductive: Unusual, large mixed solid and cystic lesion within the central pelvis  and low abdomen, with a lobular, thickened, enhancing contour and multiple enhancing internal solid nodular components. Overall lesion measures at least 16.9 x 14.8 cm (series 2, image 61), largest solid component inferiorly measures 7.7 x 5.2 cm (series 2, image 66, series 6, image 62). Status post hysterectomy. Normal ovarian tissue not confidently visualized. Other: No abdominal wall hernia or abnormality. Large volume ascites. Mild, diffuse peritoneal thickening and enhancement. Stranding and nodularity of the ventral peritoneum and/or omentum (series 2, image 48). Musculoskeletal: No acute or significant osseous findings. IMPRESSION: 1. Unusual, large mixed solid and cystic lesion within the central pelvis and low abdomen, with a lobular, thickened, enhancing contour and multiple enhancing internal solid nodular components. Overall lesion measures at least 16.9 x 14.8 cm, largest solid component inferiorly measures 7.7 x 5.2 cm. 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 2. Large volume ascites. Mild, diffuse peritoneal thickening and enhancement. Stranding and nodularity of the ventral peritoneum and/or omentum. 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 3. No evidence of lymphadenopathy in the abdomen or pelvis. Aortic Atherosclerosis (ICD10-I70.0). Electronically Signed   By: Jearld Lesch M.D.   On:  03/07/2023 13:36               LOS: 0 days   Donelda Mailhot  [facility]   Pager on [email or web address]. If 7PM-7AM, please contact night-coverage at [email or web address]     03/09/2023, 12:00 PM

## 2023-03-10 ENCOUNTER — Telehealth: Payer: Self-pay

## 2023-03-10 ENCOUNTER — Encounter: Payer: Self-pay | Admitting: Oncology

## 2023-03-10 ENCOUNTER — Other Ambulatory Visit: Payer: Self-pay | Admitting: Oncology

## 2023-03-10 DIAGNOSIS — R140 Abdominal distension (gaseous): Secondary | ICD-10-CM

## 2023-03-10 DIAGNOSIS — R1900 Intra-abdominal and pelvic swelling, mass and lump, unspecified site: Secondary | ICD-10-CM | POA: Diagnosis not present

## 2023-03-10 DIAGNOSIS — C569 Malignant neoplasm of unspecified ovary: Secondary | ICD-10-CM | POA: Insufficient documentation

## 2023-03-10 DIAGNOSIS — E860 Dehydration: Secondary | ICD-10-CM | POA: Diagnosis not present

## 2023-03-10 DIAGNOSIS — K652 Spontaneous bacterial peritonitis: Secondary | ICD-10-CM | POA: Diagnosis not present

## 2023-03-10 DIAGNOSIS — R180 Malignant ascites: Secondary | ICD-10-CM | POA: Diagnosis not present

## 2023-03-10 DIAGNOSIS — B20 Human immunodeficiency virus [HIV] disease: Secondary | ICD-10-CM

## 2023-03-10 LAB — CBC WITH DIFFERENTIAL/PLATELET
Abs Immature Granulocytes: 0.02 10*3/uL (ref 0.00–0.07)
Basophils Absolute: 0 10*3/uL (ref 0.0–0.1)
Basophils Relative: 0 %
Eosinophils Absolute: 0 10*3/uL (ref 0.0–0.5)
Eosinophils Relative: 0 %
HCT: 31.5 % — ABNORMAL LOW (ref 36.0–46.0)
Hemoglobin: 10.6 g/dL — ABNORMAL LOW (ref 12.0–15.0)
Immature Granulocytes: 0 %
Lymphocytes Relative: 20 %
Lymphs Abs: 1.4 10*3/uL (ref 0.7–4.0)
MCH: 30.8 pg (ref 26.0–34.0)
MCHC: 33.7 g/dL (ref 30.0–36.0)
MCV: 91.6 fL (ref 80.0–100.0)
Monocytes Absolute: 0.3 10*3/uL (ref 0.1–1.0)
Monocytes Relative: 4 %
Neutro Abs: 5.3 10*3/uL (ref 1.7–7.7)
Neutrophils Relative %: 76 %
Platelets: 252 10*3/uL (ref 150–400)
RBC: 3.44 MIL/uL — ABNORMAL LOW (ref 3.87–5.11)
RDW: 13.7 % (ref 11.5–15.5)
WBC: 7 10*3/uL (ref 4.0–10.5)
nRBC: 0 % (ref 0.0–0.2)

## 2023-03-10 LAB — GLUCOSE, CAPILLARY
Glucose-Capillary: 106 mg/dL — ABNORMAL HIGH (ref 70–99)
Glucose-Capillary: 98 mg/dL (ref 70–99)
Glucose-Capillary: 123 mg/dL — ABNORMAL HIGH (ref 70–99)
Glucose-Capillary: 100 mg/dL — ABNORMAL HIGH (ref 70–99)

## 2023-03-10 LAB — BASIC METABOLIC PANEL
Anion gap: 5 (ref 5–15)
BUN: 11 mg/dL (ref 6–20)
CO2: 27 mmol/L (ref 22–32)
Calcium: 7.8 mg/dL — ABNORMAL LOW (ref 8.9–10.3)
Chloride: 100 mmol/L (ref 98–111)
Creatinine, Ser: 0.89 mg/dL (ref 0.44–1.00)
GFR, Estimated: >60 mL/min (ref >60)
Glucose, Bld: 103 mg/dL — ABNORMAL HIGH (ref 70–99)
Potassium: 3.9 mmol/L (ref 3.5–5.1)
Sodium: 132 mmol/L — ABNORMAL LOW (ref 135–145)

## 2023-03-10 LAB — SODIUM: Sodium: 135 mmol/L (ref 135–145)

## 2023-03-10 LAB — MAGNESIUM: Magnesium: 2.6 mg/dL — ABNORMAL HIGH (ref 1.7–2.4)

## 2023-03-10 LAB — PHOSPHORUS
Phosphorus: <1 mg/dL — CL (ref 2.5–4.6)
Phosphorus: 3.6 mg/dL (ref 2.5–4.6)

## 2023-03-10 LAB — POTASSIUM: Potassium: 4 mmol/L (ref 3.5–5.1)

## 2023-03-10 LAB — CA 125: Cancer Antigen (CA) 125: 41.2 U/mL — ABNORMAL HIGH (ref 0.0–38.1)

## 2023-03-10 LAB — HEMOGLOBIN A1C
Hgb A1c MFr Bld: 6 % — ABNORMAL HIGH (ref 4.8–5.6)
Mean Plasma Glucose: 126 mg/dL

## 2023-03-10 LAB — RETIC PANEL
Immature Retic Fract: 23 % — ABNORMAL HIGH (ref 2.3–15.9)
RBC.: 3.5 MIL/uL — ABNORMAL LOW (ref 3.87–5.11)
Retic Count, Absolute: 43.6 10*3/uL (ref 19.0–186.0)
Retic Ct Pct: 1.3 % (ref 0.4–3.1)
Reticulocyte Hemoglobin: 29 pg (ref >27.9)

## 2023-03-10 MED ORDER — DEXAMETHASONE 4 MG PO TABS
ORAL_TABLET | ORAL | 1 refills | Status: DC
Start: 2023-03-10 — End: 2023-05-12

## 2023-03-10 MED ORDER — PROCHLORPERAZINE MALEATE 10 MG PO TABS: 10.0000 mg | ORAL_TABLET | Freq: Four times a day (QID) | ORAL | 1 refills | Status: DC | PRN

## 2023-03-10 MED ORDER — POTASSIUM PHOSPHATES 15 MMOLE/5ML IV SOLN
45.0000 mmol | Freq: Once | INTRAVENOUS | Status: AC
  Administered 2023-03-10: 45 mmol via INTRAVENOUS
  Filled 2023-03-10: qty 15

## 2023-03-10 MED ORDER — SODIUM PHOSPHATES 45 MMOLE/15ML IV SOLN
30.0000 mmol | Freq: Once | INTRAVENOUS | Status: DC
  Filled 2023-03-10: qty 10

## 2023-03-10 MED ORDER — ONDANSETRON HCL 8 MG PO TABS: 8.0000 mg | ORAL_TABLET | Freq: Three times a day (TID) | ORAL | 1 refills | Status: DC | PRN

## 2023-03-10 NOTE — Progress Notes (Signed)
START ON PATHWAY REGIMEN - Ovarian     A cycle is every 21 days:     Paclitaxel      Carboplatin   **Always confirm dose/schedule in your pharmacy ordering system**  Patient Characteristics: Preoperative or Nonsurgical Candidate (Clinical Staging), Newly Diagnosed, Neoadjuvant Therapy followed by Surgery BRCA Mutation Status: Awaiting Test Results Therapeutic Status: Preoperative or Nonsurgical Candidate (Clinical Staging) AJCC T Category: cT3c AJCC 8 Stage Grouping: IIIC AJCC N Category: cN0 AJCC M Category: cM0 Therapy Plan: Neoadjuvant Therapy followed by Surgery Intent of Therapy: Curative Intent, Discussed with Patient

## 2023-03-10 NOTE — Telephone Encounter (Signed)
Per [place] message from Dr. Cathie Hoops " please schedule her to have chemo class for carboplatin Taxol Q3w, tentatively schedule lab MD carboplatin taxol on 7/25 I will update chemo IS"  Please inform pt of appts

## 2023-03-10 NOTE — Plan of Care (Signed)
  Problem: Education: Goal: Knowledge of General Education information will improve Description: Including pain rating scale, medication(s)/side effects and non-pharmacologic comfort measures Outcome: Progressing   Problem: Activity: Goal: Risk for activity intolerance will decrease Outcome: Progressing   Problem: Nutrition: Goal: Adequate nutrition will be maintained Outcome: Progressing   Problem: Coping: Goal: Level of anxiety will decrease Outcome: Progressing   Problem: Elimination: Goal: Will not experience complications related to bowel motility Outcome: Progressing   Problem: Pain Managment: Goal: General experience of comfort will improve Outcome: Progressing   Problem: Safety: Goal: Ability to remain free from injury will improve Outcome: Progressing   Problem: Skin Integrity: Goal: Risk for impaired skin integrity will decrease Outcome: Progressing   Problem: Education: Goal: Ability to describe self-care measures that may prevent or decrease complications (Diabetes Survival Skills Education) will improve Outcome: Progressing   Problem: Coping: Goal: Ability to adjust to condition or change in health will improve Outcome: Progressing   Problem: Nutritional: Goal: Maintenance of adequate nutrition will improve Outcome: Progressing   Problem: Skin Integrity: Goal: Risk for impaired skin integrity will decrease Outcome: Progressing   Problem: Tissue Perfusion: Goal: Adequacy of tissue perfusion will improve Outcome: Progressing

## 2023-03-10 NOTE — Consult Note (Signed)
Hematology/Oncology Consult note [phone] [phone]      Patient Care Team: Jodi Marble, NP as PCP - General (Nurse Practitioner) Debbe Odea, MD as PCP - Cardiology (Cardiology) Benita Gutter, RN as Oncology Nurse Navigator [place], [place], MD as Referring Physician (Obstetrics)   Name of the patient: Julie Nolan  [id number]  04-13-1966   REASON FOR COSULTATION:   History of presenting illness-  57 y.o. female with PMH listed at below who presents to ER for evaluation of nausea vomiting, abdominal extension.  She was seen by GynOnc  on 03/08/2023 for suspected pelvic mass, ascites.   03/07/2023 CT chest abdomen pelvis w contrast showed  Chest  1. No signs of metastatic disease to the chest. 2. Trace pleural fluid within the posterior lung bases. 3. Areas of subsegmental atelectasis with volume loss noted within both lower lobes, right middle lobe and inferior lingula. 4. Large volume of abdominal ascites. Refer to CT AP report from earlier today. 5. Small hiatal hernia  Abdomen pelvis  1. Unusual, large mixed solid and cystic lesion within the central pelvis and low abdomen, with a lobular, thickened, enhancing contour and multiple enhancing internal solid nodular components. Overall lesion measures at least 16.9 x 14.8 cm, largest solid component inferiorly measures 7.7 x 5.2 cm. 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 2. Large volume ascites. Mild, diffuse peritoneal thickening and enhancement. Stranding and nodularity of the ventral peritoneum and/or omentum. 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 3. No evidence of lymphadenopathy in the abdomen or pelvis. Aortic Atherosclerosis  Patient was not able to tolerate any oral for 2 days, with emesis. Kidney function also has decreased.  She was sent to ER for paracentesis, hydration.  Oncology was consulted for  further evaluation. I discussed with ER physician Dr. Modesto Charon and recommended admission.  03/09/2023 s/p diagnostic and therapeutic paracentesis.  Cytology and fluid analysis results are pending.   Today she reports feeling much better. She is able tolerate po intake. Nausea has improved.   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   Cough 03/16/2012   Hypercholesteremia 03/16/2012   Hypertension 12/16/2011     Past Medical History:  Diagnosis Date   Diabetes mellitus without complication (HCC)    HIV (human immunodeficiency virus infection) (HCC)    Hyperlipidemia    Hypertension      Past Surgical History:  Procedure Laterality Date   COLONOSCOPY WITH PROPOFOL N/A 05/29/2020   Procedure: COLONOSCOPY WITH PROPOFOL;  Surgeon: Midge Minium, MD;  Location: [facility];  Service: Endoscopy;  Laterality: N/A;   PARTIAL HYSTERECTOMY     TONSILLECTOMY      Social History   Socioeconomic History   Marital status: Divorced    Spouse name: Not on file   Number of children: Not on file   Years of education: Not on file   Highest education level: Not on file  Occupational History   Not on file  Tobacco Use   Smoking status: Former    Current packs/day: 0.00    Types: Cigarettes    Quit  date: 46    Years since quitting: 29.5   Smokeless tobacco: Never  Vaping Use   Vaping status: Never Used  Substance and Sexual Activity   Alcohol use: Yes    Alcohol/week: 1.0 - 2.0 standard drink of alcohol    Types: 1 - 2 Cans of beer per week   Drug use: Not Currently    Sexual activity: Yes    Birth control/protection: None  Other Topics Concern   Not on file  Social History Narrative   Not on file   Social Determinants of Health   Financial Resource Strain: Not on file  Food Insecurity: No Food Insecurity (03/08/2023)   Hunger Vital Sign    Worried About Running Out of Food in the Last Year: Never true    Ran Out of Food in the Last Year: Never true  Transportation Needs: No Transportation Needs (03/08/2023)   PRAPARE - Administrator, Civil Service (Medical): No    Lack of Transportation (Non-Medical): No  Physical Activity: Not on file  Stress: Not on file  Social Connections: Not on file  Intimate Partner Violence: Not At Risk (03/08/2023)   Humiliation, Afraid, Rape, and Kick questionnaire    Fear of Current or Ex-Partner: No    Emotionally Abused: No    Physically Abused: No    Sexually Abused: No     Family History  Problem Relation Age of Onset   Breast cancer Neg Hx      Current Facility-Administered Medications:    acetaminophen (TYLENOL) tablet 650 mg, 650 mg, Oral, Q6H PRN **OR** acetaminophen (TYLENOL) suppository 650 mg, 650 mg, Rectal, Q6H PRN, Verdene Lennert, MD   cefTRIAXone (ROCEPHIN) 2 g in sodium chloride 0.9 % 100 mL IVPB, 2 g, Intravenous, Q24H, Verdene Lennert, MD, Stopped at 03/10/23 1610   dolutegravir (TIVICAY) tablet 50 mg, 50 mg, Oral, Daily, 50 mg at 03/10/23 0945 **AND** lamiVUDine (EPIVIR) tablet 300 mg, 300 mg, Oral, Daily, Coulter, Carolyn, RPH, 300 mg at 03/10/23 0920   famotidine (PEPCID) tablet 20 mg, 20 mg, Oral, Daily PRN, Verdene Lennert, MD, 20 mg at 03/09/23 0353   HYDROmorphone (DILAUDID) injection 0.5-1 mg, 0.5-1 mg, Intravenous, Q3H PRN, Verdene Lennert, MD, 1 mg at 03/09/23 2343   insulin aspart (novoLOG) injection 0-9 Units, 0-9 Units, Subcutaneous, TID WC, Verdene Lennert, MD, 1 Units at 03/09/23 1644   ondansetron (ZOFRAN) tablet 4 mg, 4 mg, Oral, Q6H PRN **OR** ondansetron (ZOFRAN)  injection 4 mg, 4 mg, Intravenous, Q6H PRN, Verdene Lennert, MD   pantoprazole (PROTONIX) EC tablet 40 mg, 40 mg, Oral, Daily, Mansy, Jan A, MD, 40 mg at 03/10/23 0920   polyethylene glycol (MIRALAX / GLYCOLAX) packet 17 g, 17 g, Oral, Daily PRN, Verdene Lennert, MD   potassium PHOSPHATE 45 mmol in dextrose 5 % 500 mL infusion, 45 mmol, Intravenous, Once, Lindajo Royal V, MD, Last Rate: 64.4 mL/hr at 03/10/23 0928, 45 mmol at 03/10/23 0928   promethazine (PHENERGAN) 12.5 mg in sodium chloride 0.9 % 50 mL IVPB, 12.5 mg, Intravenous, Q6H PRN, Mansy, Vernetta Honey, MD, Stopped at 03/09/23 0404   rosuvastatin (CRESTOR) tablet 20 mg, 20 mg, Oral, Daily, Verdene Lennert, MD, 20 mg at 03/10/23 0920   sodium chloride flush (NS) 0.9 % injection 3 mL, 3 mL, Intravenous, Q12H, Verdene Lennert, MD, 3 mL at 03/10/23 9604  Review of Systems  Constitutional:  Negative for appetite change, chills, fatigue and fever.  HENT:   Negative for hearing loss and voice  change.   Eyes:  Negative for eye problems.  Respiratory:  Negative for chest tightness and cough.   Cardiovascular:  Negative for chest pain.  Gastrointestinal:  Positive for abdominal distention, nausea and vomiting. Negative for abdominal pain and blood in stool.  Endocrine: Negative for hot flashes.  Genitourinary:  Negative for difficulty urinating and frequency.   Musculoskeletal:  Negative for arthralgias.  Skin:  Negative for itching and rash.  Neurological:  Negative for extremity weakness.  Hematological:  Negative for adenopathy.  Psychiatric/Behavioral:  Negative for confusion.     PHYSICAL EXAM Vitals:   03/09/23 2307 03/10/23 0219 03/10/23 0500 03/10/23 0735  BP: 113/83  118/70 107/72  Pulse: 98   78  Resp: 16   16  Temp: 98.9 F (37.2 C)  98.7 F (37.1 C) (!) 97.3 F (36.3 C)  TempSrc:   Oral Oral  SpO2: 98%  96% 96%  Weight:  165 lb 5.5 oz (75 kg)    Height:       Physical Exam Constitutional:      General: She is not in acute  distress.    Appearance: She is not diaphoretic.  HENT:     Head: Normocephalic and atraumatic.     Nose: Nose normal.     Mouth/Throat:     Pharynx: No oropharyngeal exudate.  Eyes:     General: No scleral icterus.    Pupils: Pupils are equal, round, and reactive to light.  Cardiovascular:     Rate and Rhythm: Normal rate and regular rhythm.     Heart sounds: No murmur heard. Pulmonary:     Effort: Pulmonary effort is normal. No respiratory distress.     Breath sounds: No rales.  Chest:     Chest wall: No tenderness.  Abdominal:     General: There is distension.     Palpations: Abdomen is soft.     Tenderness: There is no abdominal tenderness.  Musculoskeletal:        General: Normal range of motion.     Cervical back: Normal range of motion and neck supple.  Skin:    General: Skin is warm and dry.     Findings: No erythema.  Neurological:     Mental Status: She is alert and oriented to person, place, and time.     Cranial Nerves: No cranial nerve deficit.     Motor: No abnormal muscle tone.     Coordination: Coordination normal.  Psychiatric:        Mood and Affect: Affect normal.       LABORATORY STUDIES    Latest Ref Rng & Units 03/10/2023    5:03 AM 03/09/2023    4:27 AM 03/08/2023   12:13 PM  CBC  WBC 4.0 - 10.5 K/uL 7.0  12.8  12.8   Hemoglobin 12.0 - 15.0 g/dL 16.1  09.6  04.5   Hematocrit 36.0 - 46.0 % 31.5  39.2  45.1   Platelets 150 - 400 K/uL 252  306  330       Latest Ref Rng & Units 03/10/2023    5:03 AM 03/09/2023    4:27 AM 03/08/2023   12:13 PM  CMP  Glucose 70 - 99 mg/dL 409  811  914   BUN 6 - 20 mg/dL 11  21  19    Creatinine 0.44 - 1.00 mg/dL 7.82  9.56  2.13   Sodium 135 - 145 mmol/L 132  130  131   Potassium 3.5 -  5.1 mmol/L 3.9  3.2  3.8   Chloride 98 - 111 mmol/L 100  97  99   CO2 22 - 32 mmol/L 27  25  25    Calcium 8.9 - 10.3 mg/dL 7.8  8.2  8.6   Total Protein 6.5 - 8.1 g/dL   7.1   Total Bilirubin 0.3 - 1.2 mg/dL   0.8   Alkaline  Phos 38 - 126 U/L   32   AST 15 - 41 U/L   39   ALT 0 - 44 U/L   16      RADIOGRAPHIC STUDIES: I have personally reviewed the radiological images as listed and agreed with the findings in the report. US Paracentesis  Result Date: 03/09/2023 INDICATION: Patient with ascites concern for malignant ascites request received for diagnostic and therapeutic paracentesis. EXAM: ULTRASOUND GUIDED  PARACENTESIS MEDICATIONS: Local lidocaine 1% only. COMPLICATIONS: None immediate. PROCEDURE: Informed written consent was obtained from the patient after a discussion of the risks, benefits and alternatives to treatment. A timeout was performed prior to the initiation of the procedure. Initial ultrasound scanning demonstrates a large amount of ascites within the right upper abdominal quadrant. The right upper abdomen was prepped and draped in the usual sterile fashion. 1% lidocaine was used for local anesthesia. Following this, a 19 gauge, 7-cm, Yueh catheter was introduced. An ultrasound image was saved for documentation purposes. The paracentesis was performed. The catheter was removed and a dressing was applied. The patient tolerated the procedure well without immediate post procedural complication. FINDINGS: A total of approximately 6.1 L of dark amber fluid was removed. Samples were sent to the laboratory as requested by the clinical team. IMPRESSION: Successful ultrasound-guided paracentesis yielding 6.1 liters of peritoneal fluid. This exam was performed by Pattricia Boss PA-C, and was supervised and interpreted by Dr. Elby Showers. Electronically Signed   By: Marliss Coots M.D.   On: 03/09/2023 15:50   CT CHEST WO CONTRAST  Result Date: 03/07/2023 CLINICAL DATA:  Evaluate for metastatic disease. Newly diagnosed ovarian cancer. * Tracking Code: BO *. EXAM: CT CHEST WITHOUT CONTRAST TECHNIQUE: Multidetector CT imaging of the chest was performed following the standard protocol without IV contrast. RADIATION DOSE  REDUCTION: This exam was performed according to the departmental dose-optimization program which includes automated exposure control, adjustment of the mA and/or kV according to patient size and/or use of iterative reconstruction technique. COMPARISON:  None FINDINGS: Cardiovascular: Heart size is within normal limits. Small anterior pericardial effusion. Aortic atherosclerosis. Mediastinum/Nodes: Thyroid gland, trachea, and esophagus are unremarkable. Small hiatal hernia noted. No enlarged mediastinal or axillary lymph nodes. Lungs/Pleura: Trace pleural fluid identified within the posterior lung bases. Areas of subsegmental atelectasis with volume loss noted within both lower lobes, right middle lobe and inferior lingula. No airspace consolidation identified. Perifissural nodule along the oblique fissure in the left midlung measures 5 mm and most likely represents a benign intrapulmonary lymph node. No follow-up imaging recommended. No suspicious pulmonary nodule or mass identified bilaterally. Upper Abdomen: Large volume of abdominal ascites is identified. Deferred to the CT performed earlier today for further details regarding the imaged portions of the upper abdomen. Musculoskeletal: No chest wall mass or suspicious bone lesions identified. IMPRESSION: 1. No signs of metastatic disease to the chest. 2. Trace pleural fluid within the posterior lung bases. 3. Areas of subsegmental atelectasis with volume loss noted within both lower lobes, right middle lobe and inferior lingula. 4. Large volume of abdominal ascites. Refer to CT AP report from earlier  today. 5. Small hiatal hernia. 6.  Aortic Atherosclerosis (ICD10-I70.0). Electronically Signed   By: Signa Kell M.D.   On: 03/07/2023 15:58   CT ABDOMEN PELVIS W CONTRAST  Result Date: 03/07/2023 CLINICAL DATA:  Abdominal pain for 2 months, HIV with recent medication change * Tracking Code: BO * EXAM: CT ABDOMEN AND PELVIS WITH CONTRAST TECHNIQUE:  Multidetector CT imaging of the abdomen and pelvis was performed using the standard protocol following bolus administration of intravenous contrast. RADIATION DOSE REDUCTION: This exam was performed according to the departmental dose-optimization program which includes automated exposure control, adjustment of the mA and/or kV according to patient size and/or use of iterative reconstruction technique. CONTRAST:  OMNIPAQUE IOHEXOL 300 MG/ML  SOLN COMPARISON:  None Available. FINDINGS: Lower chest: No acute abnormality. Hepatobiliary: No solid liver abnormality is seen. No gallstones, gallbladder wall thickening, or biliary dilatation. Pancreas: Unremarkable. No pancreatic ductal dilatation or surrounding inflammatory changes. Spleen: Normal in size without significant abnormality. Adrenals/Urinary Tract: Adrenal glands are unremarkable. Kidneys are normal, without renal calculi, solid lesion, or hydronephrosis. Bladder is unremarkable. Stomach/Bowel: Stomach is within normal limits. Appendix appears normal. No evidence of bowel wall thickening, distention, or inflammatory changes. Vascular/Lymphatic: Aortic atherosclerosis. No enlarged abdominal or pelvic lymph nodes. Reproductive: Unusual, large mixed solid and cystic lesion within the central pelvis and low abdomen, with a lobular, thickened, enhancing contour and multiple enhancing internal solid nodular components. Overall lesion measures at least 16.9 x 14.8 cm (series 2, image 61), largest solid component inferiorly measures 7.7 x 5.2 cm (series 2, image 66, series 6, image 62). Status post hysterectomy. Normal ovarian tissue not confidently visualized. Other: No abdominal wall hernia or abnormality. Large volume ascites. Mild, diffuse peritoneal thickening and enhancement. Stranding and nodularity of the ventral peritoneum and/or omentum (series 2, image 48). Musculoskeletal: No acute or significant osseous findings. IMPRESSION: 1. Unusual, large mixed  solid and cystic lesion within the central pelvis and low abdomen, with a lobular, thickened, enhancing contour and multiple enhancing internal solid nodular components. Overall lesion measures at least 16.9 x 14.8 cm, largest solid component inferiorly measures 7.7 x 5.2 cm. 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 2. Large volume ascites. Mild, diffuse peritoneal thickening and enhancement. Stranding and nodularity of the ventral peritoneum and/or omentum. 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 3. No evidence of lymphadenopathy in the abdomen or pelvis. Aortic Atherosclerosis (ICD10-I70.0). Electronically Signed   By: Jearld Lesch M.D.   On: 03/07/2023 13:36   DG BONE DENSITY (DXA)  Result Date: 01/03/2023 EXAM: DUAL X-RAY ABSORPTIOMETRY (DXA) FOR BONE MINERAL DENSITY IMPRESSION: Dear Dr. Carmela Hurt, Your patient Kendrea C Marlett completed a FRAX assessment on 01/03/2023 using the Lunar iDXA DXA System (analysis version: 14.10) manufactured by [employer]. The following summarizes the results of our evaluation. PATIENT BIOGRAPHICAL: Name: Adriena, Manfre Patient ID: [id number] Birth Date: [birth year] Height:    63.0 in. Gender:     [age]    Age:        56.7       Weight:    162.7 lbs. Ethnicity:  Black                            Exam Date: 01/03/2023 FRAX* RESULTS:  (version: 3.5) 10-year Probability of Fracture1 Major Osteoporotic Fracture2 Hip Fracture 2.8% 0.2% Population: [place] (Black) Risk Factors: None Based on Femur (Right) Neck BMD 1 -The 10-year probability of fracture may  be lower than reported if the patient has received treatment. 2 -Major Osteoporotic Fracture: Clinical Spine, Forearm, Hip or Shoulder *FRAX is a Armed forces logistics/support/administrative officer of the [employer] of [employer] for Metabolic Bone Disease, a World Science writer (WHO) [employer]. ASSESSMENT: The probability of a major osteoporotic fracture is 2.8% within the next  ten years. The probability of a hip fracture is 0.2% within the next ten years. Your patient Jahnaya Branscome completed a BMD test on 01/03/2023 using the [employer] DXA System (software version: 14.10) manufactured by [employer]. The following summarizes the results of our evaluation. Technologist: [facility] PATIENT BIOGRAPHICAL: Name: Grissel, Tyrell Patient ID: [id number] Birth Date: Sep 24, 1965 Height: 63.0 in. Gender: Female Exam Date: 01/03/2023 Weight: 162.7 lbs. Indications: Diabetic, Hysterectomy, Postmenopausal Fractures: Treatments: calcium w/ vit D, Metformin DENSITOMETRY RESULTS: Site         Region     Measured Date Measured Age WHO Classification Young Adult T-score BMD         %Change vs. Previous Significant Change (*) DualFemur Neck Right 01/03/2023 56.7 Osteopenia -1.3 0.861 g/cm2 0.3% - DualFemur Neck Right 11/24/2020 53.6 Osteopenia -1.3 0.858 g/cm2 - - DualFemur Total Mean 01/03/2023 55.7 Normal -0.9 0.894 g/cm2 1.7% - DualFemur Total Mean 11/24/2020 53.6 Normal -1.0 0.879 g/cm2 - - Left Forearm Radius 33% 01/03/2023 55.7 Normal 0.4 0.915 g/cm2 - - ASSESSMENT: The BMD measured at Femur Neck Right is 0.861 g/cm2 with a T-score of -1.3. This patient is considered osteopenic according to World Health Organization [facility]) criteria. The scan quality is good. Lumbar spine was not utilized due to advanced degenerative changes. Compared with prior study, there has been no significant change in the total hip. World Science writer [facility]) criteria for post-menopausal, Caucasian Women: Normal:                   T-score at or above -1 SD Osteopenia/low bone mass: T-score between -1 and -2.5 SD Osteoporosis:             T-score at or below -2.5 SD RECOMMENDATIONS: 1. All patients should optimize calcium and vitamin D intake. 2. Consider FDA-approved medical therapies in postmenopausal women and men aged 62 years and older, based on the following: a. A hip or vertebral(clinical or morphometric) fracture b.  T-score < -2.5 at the femoral neck or spine after appropriate evaluation to exclude secondary causes c. Low bone mass (T-score between -1.0 and -2.5 at the femoral neck or spine) and a 10-year probability of a hip fracture > 3% or a 10-year probability of a major osteoporosis-related fracture > 20% based on the [place]-adapted WHO algorithm 3. Clinician judgment and/or patient preferences may indicate treatment for people with 10-year fracture probabilities above or below these levels FOLLOW-UP: People with diagnosed cases of osteoporosis or at high risk for fracture should have regular bone mineral density tests. For patients eligible for Medicare, routine testing is allowed once every 2 years. The testing frequency can be increased to one year for patients who have rapidly progressing disease, those who are receiving or discontinuing medical therapy to restore bone mass, or have additional risk factors. I have reviewed this report, and agree with the above findings. [facility] Electronically Signed   By: Frederico Hamman M.D.   On: 01/03/2023 11:59     Assessment and plan-   # large pelvic mass with large ascites, s/p paracentesis, with removal of 6.1L of fluid.  Cytology is pending. 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 She is interested.  I will arrange her to get chemo education, and tentatively schedule chemotherapy next week once diagnosis is confirmed.    # nausea vomiting, improved. Continue PRN antiemetics  # ascites with spontaneous bacterial peritonitis. Continue IV ceftriaxone.  Follow up fluid culture  # HIV, continue dolutegravir and lamivudine   Thank you for allowing me to participate in the care of this patient.   Rickard Patience, MD, PhD Hematology Oncology 03/10/2023

## 2023-03-10 NOTE — Progress Notes (Addendum)
Progress Note    Julie Nolan  Julie Nolan:[id number] DOB: 06-Mar-1966  DOA: 03/08/2023 PCP: Jodi Marble, NP      Brief Narrative:    Medical records reviewed and are as summarized below:  Julie Nolan is a 57 y.o. 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  She was seen by a gynecologist, Dr. Sonia Side, for newly diagnosed large pelvic mass on 03/08/2023 when she was referred to the emergency department for further management because of the aforementioned symptoms.        Assessment/Plan:   Principal Problem:   Nausea and vomiting Active Problems:   Malignant ascites   Pelvic mass   HIV (human immunodeficiency virus infection) (HCC)   Hypertension   Type 2 diabetes mellitus (HCC)   Spontaneous bacterial peritonitis (HCC)   Hypophosphatemia    Body mass index is 29.29 kg/m.   Nausea and vomiting Improved.  Use antiemetics as needed.     Hypokalemia Improved   Severe hypophosphatemia Phosphorus was 1.7. Replete with IV potassium phosphate and monitor levels   Malignant ascites, spontaneous bacterial peritonitis S/p paracentesis on 03/09/2023 with removal of 6.1 L of  brown asicitic fluid. Fluid positive for spontaneous bacterial peritonitis. Fluid cytology and culture are pending.  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  Outpatient follow-up with gynecologist and Dr. Cathie Hoops, oncologist.    HIV (human immunodeficiency virus infection) (HCC) Continue dolutegravir and lamivudine    Hypertension HCTZ on hold    Type 2 diabetes mellitus (HCC) Metformin on hold.  Use NovoLog as needed for hyperglycemia.      Diet Order             Diet regular  Room service appropriate? Yes; Fluid consistency: Thin  Diet effective now                            Consultants: None  Procedures: Plan for paracentesis    Medications:    dolutegravir  50 mg Oral Daily   And   lamiVUDine  300 mg Oral Daily   insulin aspart  0-9 Units Subcutaneous TID WC   pantoprazole  40 mg Oral Daily   rosuvastatin  20 mg Oral Daily   sodium chloride flush  3 mL Intravenous Q12H   Continuous Infusions:  cefTRIAXone (ROCEPHIN)  IV Stopped (03/10/23 5621)   potassium PHOSPHATE IVPB (in mmol) 45 mmol (03/10/23 0928)   promethazine (PHENERGAN) injection (IM or IVPB) Stopped (03/09/23 0404)     Anti-infectives (From admission, onward)    Start     Dose/Rate Route Frequency Ordered Stop   03/09/23 1000  dolutegravir-lamiVUDine (DOVATO) 50-300 MG per tablet 1 tablet  Status:  Discontinued        1 tablet Oral Daily 03/08/23 1951 03/08/23 2002   03/09/23 1000  dolutegravir (TIVICAY) tablet 50 mg       Placed in "And" Linked Group   50 mg Oral Daily 03/08/23 2002     03/09/23 1000  lamiVUDine (EPIVIR) tablet 300 mg       Placed in "And" Linked Group   300 mg Oral Daily 03/08/23 2002     03/08/23 1900  cefTRIAXone (ROCEPHIN) 2 g in  sodium chloride 0.9 % 100 mL IVPB        2 g 200 mL/hr over 30 Minutes Intravenous Every 24 hours 03/08/23 1839                Family Communication/Anticipated D/C date and plan/Code Status   DVT prophylaxis: SCDs Start: 03/08/23 1838     Code Status: Full Code  Family Communication: None Disposition Plan: Plan to discharge home in 1 to 2 days   Status is: Inpatient Remains inpatient appropriate because: spontaneous bacterial peritonitis        Subjective:   Interval events noted. C/o dizziness. No vomiting, nausea. Abdominal pain is better. Alison,RN , was at the bedside  Objective:    Vitals:   03/09/23 2307 03/10/23 0219 03/10/23 0500 03/10/23 0735  BP: 113/83  118/70 107/72   Pulse: 98   78  Resp: 16   16  Temp: 98.9 F (37.2 C)  98.7 F (37.1 C) (!) 97.3 F (36.3 C)  TempSrc:   Oral Oral  SpO2: 98%  96% 96%  Weight:  75 kg    Height:       No data found.   Intake/Output Summary (Last 24 hours) at 03/10/2023 1101 Last data filed at 03/10/2023 0900 Gross per 24 hour  Intake --  Output 1 ml  Net -1 ml    Filed Weights   03/08/23 1210 03/10/23 0219  Weight: 76.2 kg 75 kg    Exam:  GEN: NAD SKIN: No rash EYES: EOMI ENT: MMM CV: RRR PULM: CTA B ABD: soft, less distended, NT, +BS CNS: AAO x 3, non focal EXT: No edema or tenderness       Data Reviewed:   I have personally reviewed following labs and imaging studies:  Labs: Labs show the following:   Basic Metabolic Panel: Recent Labs  Lab 03/07/23 1045 03/08/23 1213 03/09/23 0427 03/10/23 0503  NA 137 131* 130* 132*  K 3.5 3.8 3.2* 3.9  CL 102 99 97* 100  CO2 23 25 25 27   GLUCOSE 146* 157* 149* 103*  BUN 13 19 21* 11  CREATININE 1.01* 1.09* 1.04* 0.89  CALCIUM 9.1 8.6* 8.2* 7.8*  MG  --   --  2.5* 2.6*  PHOS  --   --  2.4* <1.0*   GFR Estimated Creatinine Clearance: 69.2 mL/min (by C-G formula based on SCr of 0.89 mg/dL). Liver Function Tests: Recent Labs  Lab 03/07/23 1045 03/08/23 1213  AST 33 39  ALT 16 16  ALKPHOS 30* 32*  BILITOT 0.9 0.8  PROT 7.4 7.1  ALBUMIN 3.8 3.5   Recent Labs  Lab 03/07/23 1045 03/08/23 1213  LIPASE 25 24   No results for input(s): "AMMONIA" in the last 168 hours. Coagulation profile No results for input(s): "INR", "PROTIME" in the last 168 hours.  CBC: Recent Labs  Lab 03/07/23 1045 03/08/23 1213 03/09/23 0427 03/10/23 0503  WBC 9.2 12.8* 12.8* 7.0  NEUTROABS  --   --  11.1* 5.3  HGB 15.4* 15.0 13.3 10.6*  HCT 47.4* 45.1 39.2 31.5*  MCV 93.1 91.9 92.0 91.6  PLT 354 330 306 252   Cardiac Enzymes: No results for input(s): "CKTOTAL", "CKMB", "CKMBINDEX", "TROPONINI" in the last 168 hours. BNP (last 3 results) No  results for input(s): "PROBNP" in the last 8760 hours. CBG: Recent Labs  Lab 03/09/23 0735 03/09/23 1210 03/09/23 1553 03/09/23 2108 03/10/23 0739  GLUCAP 117* 136* 147* 115* 106*   D-Dimer: No results for  input(s): "DDIMER" in the last 72 hours. Hgb A1c: Recent Labs    03/08/23 1213  HGBA1C 6.0*   Lipid Profile: No results for input(s): "CHOL", "HDL", "LDLCALC", "TRIG", "CHOLHDL", "LDLDIRECT" in the last 72 hours. Thyroid function studies: No results for input(s): "TSH", "T4TOTAL", "T3FREE", "THYROIDAB" in the last 72 hours.  Invalid input(s): "FREET3" Anemia work up: No results for input(s): "VITAMINB12", "FOLATE", "FERRITIN", "TIBC", "IRON", "RETICCTPCT" in the last 72 hours. Sepsis Labs: Recent Labs  Lab 03/07/23 1045 03/08/23 1213 03/09/23 0427 03/10/23 0503  WBC 9.2 12.8* 12.8* 7.0    Microbiology Recent Results (from the past 240 hour(s))  Body fluid culture w Gram Stain     Status: None (Preliminary result)   Collection Time: 03/09/23  1:42 PM   Specimen: PATH Cytology Peritoneal fluid  Result Value Ref Range Status   Specimen Description   Final    PERITONEAL Performed at [facility], [address], [place], [place] [address]    Special Requests   Final    NONE Performed at [facility], [address] [place], [place] [address]    Gram Stain   Final    FEW WBC PRESENT,BOTH PMN AND MONONUCLEAR NO ORGANISMS SEEN    Culture   Final    NO GROWTH < 12 HOURS Performed at [facility], [address] [place], [place] [address]    Report Status PENDING  Incomplete    Procedures and diagnostic studies:  US Paracentesis  Result Date: 03/09/2023 INDICATION: Patient with ascites concern for malignant ascites request received for diagnostic and therapeutic paracentesis. EXAM: ULTRASOUND GUIDED  PARACENTESIS MEDICATIONS: Local lidocaine 1% only. COMPLICATIONS: None immediate. PROCEDURE: Informed written consent was obtained  from the patient after a discussion of the risks, benefits and alternatives to treatment. A timeout was performed prior to the initiation of the procedure. Initial ultrasound scanning demonstrates a large amount of ascites within the right upper abdominal quadrant. The right upper abdomen was prepped and draped in the usual sterile fashion. 1% lidocaine was used for local anesthesia. Following this, a 19 gauge, 7-cm, Yueh catheter was introduced. An ultrasound image was saved for documentation purposes. The paracentesis was performed. The catheter was removed and a dressing was applied. The patient tolerated the procedure well without immediate post procedural complication. FINDINGS: A total of approximately 6.1 L of dark amber fluid was removed. Samples were sent to the laboratory as requested by the clinical team. IMPRESSION: Successful ultrasound-guided paracentesis yielding 6.1 liters of peritoneal fluid. This exam was performed by Pattricia Boss PA-C, and was supervised and interpreted by Dr. Elby Showers. Electronically Signed   By: Marliss Coots M.D.   On: 03/09/2023 15:50               LOS: 1 day   Jamy Cleckler  [facility]   Pager on [email or web address]. If 7PM-7AM, please contact night-coverage at [email or web address]     03/10/2023, 11:01 AM

## 2023-03-10 NOTE — Plan of Care (Signed)
  Problem: Education: Goal: Knowledge of General Education information will improve Description: Including pain rating scale, medication(s)/side effects and non-pharmacologic comfort measures Outcome: Progressing   Problem: Health Behavior/Discharge Planning: Goal: Ability to manage health-related needs will improve Outcome: Progressing   Problem: Clinical Measurements: Goal: Ability to maintain clinical measurements within normal limits will improve Outcome: Progressing   Problem: Activity: Goal: Risk for activity intolerance will decrease Outcome: Progressing   Problem: Nutrition: Goal: Adequate nutrition will be maintained Outcome: Progressing   Problem: Coping: Goal: Level of anxiety will decrease Outcome: Progressing   Problem: Elimination: Goal: Will not experience complications related to bowel motility Outcome: Progressing   Problem: Pain Managment: Goal: General experience of comfort will improve Outcome: Progressing   Problem: Safety: Goal: Ability to remain free from injury will improve Outcome: Progressing   Problem: Skin Integrity: Goal: Risk for impaired skin integrity will decrease Outcome: Progressing   Problem: Coping: Goal: Ability to adjust to condition or change in health will improve Outcome: Progressing   Problem: Metabolic: Goal: Ability to maintain appropriate glucose levels will improve Outcome: Progressing   Problem: Skin Integrity: Goal: Risk for impaired skin integrity will decrease Outcome: Progressing   Problem: Tissue Perfusion: Goal: Adequacy of tissue perfusion will improve Outcome: Progressing

## 2023-03-11 DIAGNOSIS — R180 Malignant ascites: Secondary | ICD-10-CM | POA: Diagnosis not present

## 2023-03-11 DIAGNOSIS — K652 Spontaneous bacterial peritonitis: Secondary | ICD-10-CM | POA: Diagnosis not present

## 2023-03-11 LAB — GLUCOSE, CAPILLARY
Glucose-Capillary: 96 mg/dL (ref 70–99)
Glucose-Capillary: 95 mg/dL (ref 70–99)
Glucose-Capillary: 87 mg/dL (ref 70–99)
Glucose-Capillary: 110 mg/dL — ABNORMAL HIGH (ref 70–99)

## 2023-03-11 MED ORDER — POLYETHYLENE GLYCOL 3350 17 G PO PACK
17.0000 g | PACK | Freq: Every day | ORAL | Status: DC
  Administered 2023-03-11 – 2023-03-12 (×2): 17 g via ORAL
  Filled 2023-03-11: qty 1

## 2023-03-11 MED ORDER — HYDROCODONE-ACETAMINOPHEN 5-325 MG PO TABS
1.0000 | ORAL_TABLET | Freq: Four times a day (QID) | ORAL | Status: DC | PRN
  Administered 2023-03-12: 2 via ORAL
  Filled 2023-03-11: qty 2

## 2023-03-11 NOTE — Progress Notes (Signed)
Progress Note    Julie Nolan  YNW:[id number] DOB: 1966/07/05  DOA: 03/08/2023 PCP: Jodi Marble, NP      Brief Narrative:    Medical records reviewed and are as summarized below:  Julie Nolan is a 57 y.o. 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  She was seen by a gynecologist, Dr. Sonia Side, for newly diagnosed large pelvic mass on 03/08/2023 when she was referred to the emergency department for further management because of the aforementioned symptoms.        Assessment/Plan:   Principal Problem:   Nausea and vomiting Active Problems:   Malignant ascites   Pelvic mass   HIV (human immunodeficiency virus infection) (HCC)   Hypertension   Type 2 diabetes mellitus (HCC)   Spontaneous bacterial peritonitis (HCC)   Hypophosphatemia   Abdominal distension   Dehydration    Body mass index is 28.55 kg/m.   Nausea and vomiting Improved     Hypokalemia and hypophosphatemia Improved   Malignant ascites, spontaneous bacterial peritonitis S/p paracentesis on 03/09/2023 with removal of 6.1 L of  brown asicitic fluid. Fluid positive for spontaneous bacterial peritonitis. Fluid cytology and culture are pending.  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  Outpatient follow-up with gynecologist and Dr. Cathie Hoops, oncologist.    HIV (human immunodeficiency virus infection) (HCC) Continue dolutegravir and lamivudine    Hypertension HCTZ on hold    Type 2 diabetes mellitus (HCC) Metformin on hold.  Use NovoLog as needed for hyperglycemia.    Constipation MiraLAX has been prescribed    Diet Order             Diet regular Room service appropriate? Yes;  Fluid consistency: Thin  Diet effective now                            Consultants: Oncologist  Procedures: Paracentesis    Medications:    dolutegravir  50 mg Oral Daily   And   lamiVUDine  300 mg Oral Daily   insulin aspart  0-9 Units Subcutaneous TID WC   pantoprazole  40 mg Oral Daily   polyethylene glycol  17 g Oral Daily   rosuvastatin  20 mg Oral Daily   sodium chloride flush  3 mL Intravenous Q12H   Continuous Infusions:  cefTRIAXone (ROCEPHIN)  IV 2 g (03/10/23 1815)   promethazine (PHENERGAN) injection (IM or IVPB) Stopped (03/09/23 0404)     Anti-infectives (From admission, onward)    Start     Dose/Rate Route Frequency Ordered Stop   03/09/23 1000  dolutegravir-lamiVUDine (DOVATO) 50-300 MG per tablet 1 tablet  Status:  Discontinued        1 tablet Oral Daily 03/08/23 1951 03/08/23 2002   03/09/23 1000  dolutegravir (TIVICAY) tablet 50 mg       Placed in "And" Linked Group   50 mg Oral Daily 03/08/23 2002     03/09/23 1000  lamiVUDine (EPIVIR) tablet 300 mg       Placed in "And" Linked Group   300 mg Oral Daily 03/08/23 2002     03/08/23 1900  cefTRIAXone (ROCEPHIN) 2 g in sodium chloride 0.9 % 100 mL IVPB  2 g 200 mL/hr over 30 Minutes Intravenous Every 24 hours 03/08/23 1839                Family Communication/Anticipated D/C date and plan/Code Status   DVT prophylaxis: SCDs Start: 03/08/23 1838     Code Status: Full Code  Family Communication: None Disposition Plan: Plan to discharge home in 1 to 2 days   Status is: Inpatient Remains inpatient appropriate because: spontaneous bacterial peritonitis        Subjective:   Interval events noted.  She complains of constipation.  She had abdominal pain earlier this morning but it improved with IV Dilaudid.  Yasmin, RN, was at the bedside  Objective:    Vitals:   03/10/23 2329 03/11/23 0415 03/11/23 0500 03/11/23 0735  BP: 132/84 (!) 144/80  121/77  Pulse:  74 76  65  Resp: 20 16  16   Temp: 98.8 F (37.1 C) 99 F (37.2 C)  98.1 F (36.7 C)  TempSrc: Oral Oral    SpO2: 99% 99%  97%  Weight:   73.1 kg   Height:       No data found.   Intake/Output Summary (Last 24 hours) at 03/11/2023 1055 Last data filed at 03/10/2023 1802 Gross per 24 hour  Intake 100 ml  Output 2 ml  Net 98 ml    Filed Weights   03/08/23 1210 03/10/23 0219 03/11/23 0500  Weight: 76.2 kg 75 kg 73.1 kg    Exam:  GEN: NAD SKIN: No rash EYES: EOMI ENT: MMM CV: RRR PULM: CTA B ABD: soft, less distended, NT, +BS CNS: AAO x 3, non focal EXT: No edema or tenderness      Data Reviewed:   I have personally reviewed following labs and imaging studies:  Labs: Labs show the following:   Basic Metabolic Panel: Recent Labs  Lab 03/07/23 1045 03/08/23 1213 03/09/23 0427 03/10/23 0503 03/10/23 2109  NA 137 131* 130* 132* 135  K 3.5 3.8 3.2* 3.9 4.0  CL 102 99 97* 100  --   CO2 23 25 25 27   --   GLUCOSE 146* 157* 149* 103*  --   BUN 13 19 21* 11  --   CREATININE 1.01* 1.09* 1.04* 0.89  --   CALCIUM 9.1 8.6* 8.2* 7.8*  --   MG  --   --  2.5* 2.6*  --   PHOS  --   --  2.4* <1.0* 3.6   GFR Estimated Creatinine Clearance: 68.4 mL/min (by C-G formula based on SCr of 0.89 mg/dL). Liver Function Tests: Recent Labs  Lab 03/07/23 1045 03/08/23 1213  AST 33 39  ALT 16 16  ALKPHOS 30* 32*  BILITOT 0.9 0.8  PROT 7.4 7.1  ALBUMIN 3.8 3.5   Recent Labs  Lab 03/07/23 1045 03/08/23 1213  LIPASE 25 24   No results for input(s): "AMMONIA" in the last 168 hours. Coagulation profile No results for input(s): "INR", "PROTIME" in the last 168 hours.  CBC: Recent Labs  Lab 03/07/23 1045 03/08/23 1213 03/09/23 0427 03/10/23 0503  WBC 9.2 12.8* 12.8* 7.0  NEUTROABS  --   --  11.1* 5.3  HGB 15.4* 15.0 13.3 10.6*  HCT 47.4* 45.1 39.2 31.5*  MCV 93.1 91.9 92.0 91.6  PLT 354 330 306 252   Cardiac Enzymes: No results for input(s): "CKTOTAL",  "CKMB", "CKMBINDEX", "TROPONINI" in the last 168 hours. BNP (last 3 results) No results for input(s): "PROBNP" in the last 8760 hours. CBG:  Recent Labs  Lab 03/10/23 0739 03/10/23 1137 03/10/23 1540 03/10/23 2019 03/11/23 0736  GLUCAP 106* 98 123* 100* 96   D-Dimer: No results for input(s): "DDIMER" in the last 72 hours. Hgb A1c: Recent Labs    03/08/23 1213  HGBA1C 6.0*   Lipid Profile: No results for input(s): "CHOL", "HDL", "LDLCALC", "TRIG", "CHOLHDL", "LDLDIRECT" in the last 72 hours. Thyroid function studies: No results for input(s): "TSH", "T4TOTAL", "T3FREE", "THYROIDAB" in the last 72 hours.  Invalid input(s): "FREET3" Anemia work up: Recent Labs    03/10/23 0539  RETICCTPCT 1.3   Sepsis Labs: Recent Labs  Lab 03/07/23 1045 03/08/23 1213 03/09/23 0427 03/10/23 0503  WBC 9.2 12.8* 12.8* 7.0    Microbiology Recent Results (from the past 240 hour(s))  Body fluid culture w Gram Stain     Status: None (Preliminary result)   Collection Time: 03/09/23  1:42 PM   Specimen: PATH Cytology Peritoneal fluid  Result Value Ref Range Status   Specimen Description   Final    PERITONEAL Performed at [facility], [address], [place], [place] [address]    Special Requests   Final    NONE Performed at [facility], [address] [place], [place] [address]    Gram Stain   Final    FEW WBC PRESENT,BOTH PMN AND MONONUCLEAR NO ORGANISMS SEEN    Culture   Final    NO GROWTH 2 DAYS Performed at [facility], [address], [place], [place] [address]    Report Status PENDING  Incomplete    Procedures and diagnostic studies:  US Paracentesis  Result Date: 03/09/2023 INDICATION: Patient with ascites concern for malignant ascites request received for diagnostic and therapeutic paracentesis. EXAM: ULTRASOUND GUIDED  PARACENTESIS MEDICATIONS: Local lidocaine 1% only. COMPLICATIONS: None immediate. PROCEDURE: Informed written consent  was obtained from the patient after a discussion of the risks, benefits and alternatives to treatment. A timeout was performed prior to the initiation of the procedure. Initial ultrasound scanning demonstrates a large amount of ascites within the right upper abdominal quadrant. The right upper abdomen was prepped and draped in the usual sterile fashion. 1% lidocaine was used for local anesthesia. Following this, a 19 gauge, 7-cm, Yueh catheter was introduced. An ultrasound image was saved for documentation purposes. The paracentesis was performed. The catheter was removed and a dressing was applied. The patient tolerated the procedure well without immediate post procedural complication. FINDINGS: A total of approximately 6.1 L of dark amber fluid was removed. Samples were sent to the laboratory as requested by the clinical team. IMPRESSION: Successful ultrasound-guided paracentesis yielding 6.1 liters of peritoneal fluid. This exam was performed by Pattricia Boss PA-C, and was supervised and interpreted by Dr. Elby Showers. Electronically Signed   By: Marliss Coots M.D.   On: 03/09/2023 15:50               LOS: 2 days   Sarabelle Genson  [facility]   Pager on [email or web address]. If 7PM-7AM, please contact night-coverage at [email or web address]     03/11/2023, 10:55 AM

## 2023-03-12 DIAGNOSIS — K652 Spontaneous bacterial peritonitis: Secondary | ICD-10-CM | POA: Diagnosis not present

## 2023-03-12 LAB — GLUCOSE, CAPILLARY
Glucose-Capillary: 85 mg/dL (ref 70–99)
Glucose-Capillary: 141 mg/dL — ABNORMAL HIGH (ref 70–99)
Glucose-Capillary: 100 mg/dL — ABNORMAL HIGH (ref 70–99)

## 2023-03-12 MED ORDER — SODIUM CHLORIDE 0.9 % IV SOLN
2.0000 g | Freq: Once | INTRAVENOUS | Status: AC
  Administered 2023-03-12: 2 g via INTRAVENOUS
  Filled 2023-03-12: qty 20

## 2023-03-12 NOTE — Discharge Summary (Signed)
Physician Discharge Summary   Patient: Julie Nolan: [id number] DOB: 1966-01-23  Admit date:     03/08/2023  Discharge date: 03/12/23  Discharge Physician: Lurene Shadow   PCP: Jodi Marble, NP   Recommendations at discharge:   Follow-up with oncology team for chemotherapy classes on 03/13/2023 Follow-up with Dr. Cathie Hoops, oncologist, on 03/16/2023  Discharge Diagnoses: Principal Problem:   Spontaneous bacterial peritonitis (HCC) Active Problems:   Nausea and vomiting   Malignant ascites   Pelvic mass   HIV (human immunodeficiency virus infection) (HCC)   Hypertension   Type 2 diabetes mellitus (HCC)   Hypophosphatemia   Abdominal distension   Dehydration  Resolved Problems:   * No resolved hospital problems. *  Hospital Course:  Julie Nolan is a 58 y.o. 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  She was seen by a gynecologist, Dr. Sonia Side, for newly diagnosed large pelvic mass on 03/08/2023 when she was referred to the emergency department for further management because of the aforementioned symptoms.     Assessment and Plan:   Nausea and vomiting Improved     Hypokalemia and hypophosphatemia Improved     Malignant ascites, spontaneous bacterial peritonitis S/p paracentesis on 03/09/2023 with removal of 6.1 L of  brown asicitic fluid. Fluid positive for spontaneous bacterial peritonitis. Completed 5 days of IV ceftriaxone. Fluid cytology and culture are pending at the time of discharge. She will follow-up with Dr. Cathie Hoops, oncologist on 03/16/2023.    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  Outpatient follow-up with gynecologist and Dr. Cathie Hoops, oncologist.     HIV (human immunodeficiency  virus infection) (HCC) Continue dolutegravir and lamivudine     Hypertension Resume HCTZ at discharge     Type 2 diabetes mellitus (HCC) Resume metformin at discharge     Constipation Improved   Her condition is improved and she is deemed stable for discharge to home.      Consultants: Oncologist Procedures performed: Paracentesis Disposition: Home Diet recommendation:  Cardiac diet DISCHARGE MEDICATION: Allergies as of 03/12/2023   No Known Allergies      Medication List     STOP taking these medications    bictegravir-emtricitabine-tenofovir AF 50-200-25 MG Tabs tablet Commonly known as: BIKTARVY   MULTIVITAMIN ADULT PO       TAKE these medications    Caltrate 600+D3 Soft 600-800 MG-UNIT Chew Generic drug: Calcium Carb-Cholecalciferol Chew 1 tablet by mouth daily.   cetirizine 10 MG tablet Commonly known as: ZYRTEC Take 10 mg by mouth daily.   dexamethasone 4 MG tablet Commonly known as: DECADRON Take 2 tablets (8mg ) by mouth daily starting the day after carboplatin for 3 days. Take with food   Dovato 50-300 MG tablet Generic drug: dolutegravir-lamiVUDine Take 1 tablet by mouth daily.   famotidine 20 MG tablet Commonly known as: PEPCID Take 1 tablet (20 mg total) by mouth daily.   Flonase Allergy Relief 50 MCG/ACT nasal spray Generic drug: fluticasone Place 1-2 sprays into both nostrils daily.   hydrochlorothiazide 25 MG tablet Commonly known as: HYDRODIURIL Take 25 mg by mouth daily. What changed: Another medication with the same name was removed. Continue taking this medication, and follow the directions you see here.   HYDROcodone-acetaminophen 5-325 MG tablet Commonly known as: Norco Take 1  tablet by mouth 3 (three) times daily as needed for up to 7 days.   metFORMIN 500 MG tablet Commonly known as: GLUCOPHAGE Take 500 mg by mouth daily.   ondansetron 8 MG tablet Commonly known as: Zofran Take 1 tablet (8 mg total) by mouth  every 8 (eight) hours as needed for nausea or vomiting. Start on the third day after carboplatin.   prochlorperazine 10 MG tablet Commonly known as: COMPAZINE Take 1 tablet (10 mg total) by mouth every 6 (six) hours as needed for nausea or vomiting.   rosuvastatin 20 MG tablet Commonly known as: CRESTOR Take 1 tablet (20 mg total) by mouth daily.        Discharge Exam: Filed Weights   03/10/23 0219 03/11/23 0500 03/12/23 0500  Weight: 75 kg 73.1 kg 71.9 kg   GEN: NAD SKIN: No rash EYES: EOMI ENT: MMM CV: RRR PULM: CTA B ABD: soft, less distended, NT, +BS CNS: AAO x 3, non focal EXT: No edema or tenderness   Condition at discharge: good  The results of significant diagnostics from this hospitalization (including imaging, microbiology, ancillary and laboratory) are listed below for reference.   Imaging Studies: US Paracentesis  Result Date: 03/09/2023 INDICATION: Patient with ascites concern for malignant ascites request received for diagnostic and therapeutic paracentesis. EXAM: ULTRASOUND GUIDED  PARACENTESIS MEDICATIONS: Local lidocaine 1% only. COMPLICATIONS: None immediate. PROCEDURE: Informed written consent was obtained from the patient after a discussion of the risks, benefits and alternatives to treatment. A timeout was performed prior to the initiation of the procedure. Initial ultrasound scanning demonstrates a large amount of ascites within the right upper abdominal quadrant. The right upper abdomen was prepped and draped in the usual sterile fashion. 1% lidocaine was used for local anesthesia. Following this, a 19 gauge, 7-cm, Yueh catheter was introduced. An ultrasound image was saved for documentation purposes. The paracentesis was performed. The catheter was removed and a dressing was applied. The patient tolerated the procedure well without immediate post procedural complication. FINDINGS: A total of approximately 6.1 L of dark amber fluid was removed. Samples were  sent to the laboratory as requested by the clinical team. IMPRESSION: Successful ultrasound-guided paracentesis yielding 6.1 liters of peritoneal fluid. This exam was performed by Pattricia Boss PA-C, and was supervised and interpreted by Dr. Elby Showers. Electronically Signed   By: Marliss Coots M.D.   On: 03/09/2023 15:50   CT CHEST WO CONTRAST  Result Date: 03/07/2023 CLINICAL DATA:  Evaluate for metastatic disease. Newly diagnosed ovarian cancer. * Tracking Code: BO *. EXAM: CT CHEST WITHOUT CONTRAST TECHNIQUE: Multidetector CT imaging of the chest was performed following the standard protocol without IV contrast. RADIATION DOSE REDUCTION: This exam was performed according to the departmental dose-optimization program which includes automated exposure control, adjustment of the mA and/or kV according to patient size and/or use of iterative reconstruction technique. COMPARISON:  None FINDINGS: Cardiovascular: Heart size is within normal limits. Small anterior pericardial effusion. Aortic atherosclerosis. Mediastinum/Nodes: Thyroid gland, trachea, and esophagus are unremarkable. Small hiatal hernia noted. No enlarged mediastinal or axillary lymph nodes. Lungs/Pleura: Trace pleural fluid identified within the posterior lung bases. Areas of subsegmental atelectasis with volume loss noted within both lower lobes, right middle lobe and inferior lingula. No airspace consolidation identified. Perifissural nodule along the oblique fissure in the left midlung measures 5 mm and most likely represents a benign intrapulmonary lymph node. No follow-up imaging recommended. No suspicious pulmonary nodule or mass identified bilaterally. Upper Abdomen: Large volume of abdominal  ascites is identified. Deferred to the CT performed earlier today for further details regarding the imaged portions of the upper abdomen. Musculoskeletal: No chest wall mass or suspicious bone lesions identified. IMPRESSION: 1. No signs of metastatic disease  to the chest. 2. Trace pleural fluid within the posterior lung bases. 3. Areas of subsegmental atelectasis with volume loss noted within both lower lobes, right middle lobe and inferior lingula. 4. Large volume of abdominal ascites. Refer to CT AP report from earlier today. 5. Small hiatal hernia. 6.  Aortic Atherosclerosis (ICD10-I70.0). Electronically Signed   By: Signa Kell M.D.   On: 03/07/2023 15:58   CT ABDOMEN PELVIS W CONTRAST  Result Date: 03/07/2023 CLINICAL DATA:  Abdominal pain for 2 months, HIV with recent medication change * Tracking Code: BO * EXAM: CT ABDOMEN AND PELVIS WITH CONTRAST TECHNIQUE: Multidetector CT imaging of the abdomen and pelvis was performed using the standard protocol following bolus administration of intravenous contrast. RADIATION DOSE REDUCTION: This exam was performed according to the departmental dose-optimization program which includes automated exposure control, adjustment of the mA and/or kV according to patient size and/or use of iterative reconstruction technique. CONTRAST:  OMNIPAQUE IOHEXOL 300 MG/ML  SOLN COMPARISON:  None Available. FINDINGS: Lower chest: No acute abnormality. Hepatobiliary: No solid liver abnormality is seen. No gallstones, gallbladder wall thickening, or biliary dilatation. Pancreas: Unremarkable. No pancreatic ductal dilatation or surrounding inflammatory changes. Spleen: Normal in size without significant abnormality. Adrenals/Urinary Tract: Adrenal glands are unremarkable. Kidneys are normal, without renal calculi, solid lesion, or hydronephrosis. Bladder is unremarkable. Stomach/Bowel: Stomach is within normal limits. Appendix appears normal. No evidence of bowel wall thickening, distention, or inflammatory changes. Vascular/Lymphatic: Aortic atherosclerosis. No enlarged abdominal or pelvic lymph nodes. Reproductive: Unusual, large mixed solid and cystic lesion within the central pelvis and low abdomen, with a lobular, thickened,  enhancing contour and multiple enhancing internal solid nodular components. Overall lesion measures at least 16.9 x 14.8 cm (series 2, image 61), largest solid component inferiorly measures 7.7 x 5.2 cm (series 2, image 66, series 6, image 62). Status post hysterectomy. Normal ovarian tissue not confidently visualized. Other: No abdominal wall hernia or abnormality. Large volume ascites. Mild, diffuse peritoneal thickening and enhancement. Stranding and nodularity of the ventral peritoneum and/or omentum (series 2, image 48). Musculoskeletal: No acute or significant osseous findings. IMPRESSION: 1. Unusual, large mixed solid and cystic lesion within the central pelvis and low abdomen, with a lobular, thickened, enhancing contour and multiple enhancing internal solid nodular components. Overall lesion measures at least 16.9 x 14.8 cm, largest solid component inferiorly measures 7.7 x 5.2 cm. 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 2. Large volume ascites. Mild, diffuse peritoneal thickening and enhancement. Stranding and nodularity of the ventral peritoneum and/or omentum. 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 3. No evidence of lymphadenopathy in the abdomen or pelvis. Aortic Atherosclerosis (ICD10-I70.0). Electronically Signed   By: Jearld Lesch M.D.   On: 03/07/2023 13:36    Microbiology: Results for orders placed or performed during the hospital encounter of 03/08/23  Body fluid culture w Gram Stain     Status: None (Preliminary result)   Collection Time: 03/09/23  1:42 PM   Specimen: PATH Cytology Peritoneal fluid  Result Value Ref Range Status   Specimen Description   Final    PERITONEAL Performed at [facility], [address], [place], [place] [address]    Special Requests   Final    NONE Performed at  [facility], [address] [place], [place] [address]    Gram Stain   Final    FEW WBC PRESENT,BOTH PMN AND  MONONUCLEAR NO ORGANISMS SEEN    Culture   Final    NO GROWTH 3 DAYS Performed at [facility], [address], [place], [place] [address]    Report Status PENDING  Incomplete    Labs: CBC: Recent Labs  Lab 03/07/23 1045 03/08/23 1213 03/09/23 0427 03/10/23 0503  WBC 9.2 12.8* 12.8* 7.0  NEUTROABS  --   --  11.1* 5.3  HGB 15.4* 15.0 13.3 10.6*  HCT 47.4* 45.1 39.2 31.5*  MCV 93.1 91.9 92.0 91.6  PLT 354 330 306 252   Basic Metabolic Panel: Recent Labs  Lab 03/07/23 1045 03/08/23 1213 03/09/23 0427 03/10/23 0503 03/10/23 2109  NA 137 131* 130* 132* 135  K 3.5 3.8 3.2* 3.9 4.0  CL 102 99 97* 100  --   CO2 23 25 25 27   --   GLUCOSE 146* 157* 149* 103*  --   BUN 13 19 21* 11  --   CREATININE 1.01* 1.09* 1.04* 0.89  --   CALCIUM 9.1 8.6* 8.2* 7.8*  --   MG  --   --  2.5* 2.6*  --   PHOS  --   --  2.4* <1.0* 3.6   Liver Function Tests: Recent Labs  Lab 03/07/23 1045 03/08/23 1213  AST 33 39  ALT 16 16  ALKPHOS 30* 32*  BILITOT 0.9 0.8  PROT 7.4 7.1  ALBUMIN 3.8 3.5   CBG: Recent Labs  Lab 03/11/23 1559 03/11/23 2034 03/12/23 0731 03/12/23 1128 03/12/23 1531  GLUCAP 87 110* 85 141* 100*    Discharge time spent: greater than 30 minutes.  Signed: Lurene Shadow, MD [facility] 03/12/2023

## 2023-03-12 NOTE — Progress Notes (Incomplete)
Progress Note    [name]  AYT:[id number] DOB: February 05, 1966  DOA: 03/08/2023 PCP: Jodi Marble, NP      Brief Narrative:    Medical records reviewed and are as summarized below:  Julie Nolan is a 57 y.o. 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  She was seen by a gynecologist, Dr. Sonia Side, for newly diagnosed large pelvic mass on 03/08/2023 when she was referred to the emergency department for further management because of the aforementioned symptoms.        Assessment/Plan:   Principal Problem:   Nausea and vomiting Active Problems:   Malignant ascites   Pelvic mass   HIV (human immunodeficiency virus infection) (HCC)   Hypertension   Type 2 diabetes mellitus (HCC)   Spontaneous bacterial peritonitis (HCC)   Hypophosphatemia   Abdominal distension   Dehydration    Body mass index is 28.08 kg/m.   Nausea and vomiting Improved     Hypokalemia and hypophosphatemia Improved   Malignant ascites, spontaneous bacterial peritonitis S/p paracentesis on 03/09/2023 with removal of 6.1 L of  brown asicitic fluid. Fluid positive for spontaneous bacterial peritonitis. Fluid cytology and culture are pending.  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  Outpatient follow-up with gynecologist and Dr. Cathie Hoops, oncologist.    HIV (human immunodeficiency virus infection) (HCC) Continue dolutegravir and lamivudine    Hypertension HCTZ on hold    Type 2 diabetes mellitus (HCC) Metformin on hold.  Use NovoLog as needed for hyperglycemia.    Constipation MiraLAX has been prescribed    Diet Order             Diet regular Room service appropriate? Yes;  Fluid consistency: Thin  Diet effective now                            Consultants: Oncologist  Procedures: Paracentesis    Medications:    dolutegravir  50 mg Oral Daily   And   lamiVUDine  300 mg Oral Daily   insulin aspart  0-9 Units Subcutaneous TID WC   pantoprazole  40 mg Oral Daily   polyethylene glycol  17 g Oral Daily   rosuvastatin  20 mg Oral Daily   sodium chloride flush  3 mL Intravenous Q12H   Continuous Infusions:  cefTRIAXone (ROCEPHIN)  IV     promethazine (PHENERGAN) injection (IM or IVPB) Stopped (03/09/23 0404)     Anti-infectives (From admission, onward)    Start     Dose/Rate Route Frequency Ordered Stop   03/12/23 1600  cefTRIAXone (ROCEPHIN) 2 g in sodium chloride 0.9 % 100 mL IVPB        2 g 200 mL/hr over 30 Minutes Intravenous  Once 03/12/23 1231     03/09/23 1000  dolutegravir-lamiVUDine (DOVATO) 50-300 MG per tablet 1 tablet  Status:  Discontinued        1 tablet Oral Daily 03/08/23 1951 03/08/23 2002   03/09/23 1000  dolutegravir (TIVICAY) tablet 50 mg       Placed in "And" Linked Group   50 mg Oral Daily 03/08/23 2002     03/09/23 1000  lamiVUDine (EPIVIR) tablet 300 mg  Placed in "And" Linked Group   300 mg Oral Daily 03/08/23 2002     03/08/23 1900  cefTRIAXone (ROCEPHIN) 2 g in sodium chloride 0.9 % 100 mL IVPB  Status:  Discontinued        2 g 200 mL/hr over 30 Minutes Intravenous Every 24 hours 03/08/23 1839 03/12/23 1231              Family Communication/Anticipated D/C date and plan/Code Status   DVT prophylaxis: SCDs Start: 03/08/23 1838     Code Status: Full Code  Family Communication: None Disposition Plan: Plan to discharge home in 1 to 2 days   Status is: Inpatient Remains inpatient appropriate because: spontaneous bacterial peritonitis        Subjective:   Interval events noted.  She complains of constipation.  She had abdominal pain earlier this morning but it improved with  IV Dilaudid.  Yasmin, RN, was at the bedside  Objective:    Vitals:   03/11/23 2033 03/12/23 0446 03/12/23 0500 03/12/23 0729  BP: 131/88 109/70  116/88  Pulse: 72 62  (!) 56  Resp: 16 16  16   Temp: 99.3 F (37.4 C) 98.8 F (37.1 C)  97.9 F (36.6 C)  TempSrc: Oral Oral    SpO2: 95% 97%  99%  Weight:   71.9 kg   Height:       No data found.  No intake or output data in the 24 hours ending 03/12/23 1444   Filed Weights   03/10/23 0219 03/11/23 0500 03/12/23 0500  Weight: 75 kg 73.1 kg 71.9 kg    Exam:  GEN: NAD SKIN: No rash EYES: EOMI ENT: MMM CV: RRR PULM: CTA B ABD: soft, less distended, NT, +BS CNS: AAO x 3, non focal EXT: No edema or tenderness      Data Reviewed:   I have personally reviewed following labs and imaging studies:  Labs: Labs show the following:   Basic Metabolic Panel: Recent Labs  Lab 03/07/23 1045 03/08/23 1213 03/09/23 0427 03/10/23 0503 03/10/23 2109  NA 137 131* 130* 132* 135  K 3.5 3.8 3.2* 3.9 4.0  CL 102 99 97* 100  --   CO2 23 25 25 27   --   GLUCOSE 146* 157* 149* 103*  --   BUN 13 19 21* 11  --   CREATININE 1.01* 1.09* 1.04* 0.89  --   CALCIUM 9.1 8.6* 8.2* 7.8*  --   MG  --   --  2.5* 2.6*  --   PHOS  --   --  2.4* <1.0* 3.6   GFR Estimated Creatinine Clearance: 67.9 mL/min (by C-G formula based on SCr of 0.89 mg/dL). Liver Function Tests: Recent Labs  Lab 03/07/23 1045 03/08/23 1213  AST 33 39  ALT 16 16  ALKPHOS 30* 32*  BILITOT 0.9 0.8  PROT 7.4 7.1  ALBUMIN 3.8 3.5   Recent Labs  Lab 03/07/23 1045 03/08/23 1213  LIPASE 25 24   No results for input(s): "AMMONIA" in the last 168 hours. Coagulation profile No results for input(s): "INR", "PROTIME" in the last 168 hours.  CBC: Recent Labs  Lab 03/07/23 1045 03/08/23 1213 03/09/23 0427 03/10/23 0503  WBC 9.2 12.8* 12.8* 7.0  NEUTROABS  --   --  11.1* 5.3  HGB 15.4* 15.0 13.3 10.6*  HCT 47.4* 45.1 39.2 31.5*  MCV 93.1 91.9 92.0 91.6   PLT 354 330 306 252   Cardiac Enzymes: No results for input(s): "CKTOTAL", "  CKMB", "CKMBINDEX", "TROPONINI" in the last 168 hours. BNP (last 3 results) No results for input(s): "PROBNP" in the last 8760 hours. CBG: Recent Labs  Lab 03/11/23 1133 03/11/23 1559 03/11/23 2034 03/12/23 0731 03/12/23 1128  GLUCAP 95 87 110* 85 141*   D-Dimer: No results for input(s): "DDIMER" in the last 72 hours. Hgb A1c: No results for input(s): "HGBA1C" in the last 72 hours.  Lipid Profile: No results for input(s): "CHOL", "HDL", "LDLCALC", "TRIG", "CHOLHDL", "LDLDIRECT" in the last 72 hours. Thyroid function studies: No results for input(s): "TSH", "T4TOTAL", "T3FREE", "THYROIDAB" in the last 72 hours.  Invalid input(s): "FREET3" Anemia work up: Recent Labs    03/10/23 0539  RETICCTPCT 1.3   Sepsis Labs: Recent Labs  Lab 03/07/23 1045 03/08/23 1213 03/09/23 0427 03/10/23 0503  WBC 9.2 12.8* 12.8* 7.0    Microbiology Recent Results (from the past 240 hour(s))  Body fluid culture w Gram Stain     Status: None (Preliminary result)   Collection Time: 03/09/23  1:42 PM   Specimen: PATH Cytology Peritoneal fluid  Result Value Ref Range Status   Specimen Description   Final    PERITONEAL Performed at [facility], [address] [place], [place] [address]    Special Requests   Final    NONE Performed at [facility], [address] [place], [place] [address]    Gram Stain   Final    FEW WBC PRESENT,BOTH PMN AND MONONUCLEAR NO ORGANISMS SEEN    Culture   Final    NO GROWTH 3 DAYS Performed at [facility], [address], [place], [place] [address]    Report Status PENDING  Incomplete    Procedures and diagnostic studies:  No results found.             LOS: 3 days   Natasha Burda  [facility]   Pager on [email or web address]. If 7PM-7AM, please contact night-coverage at [email or web address]     03/12/2023, 2:44 PM

## 2023-03-13 ENCOUNTER — Inpatient Hospital Stay: Payer: BC Managed Care – PPO

## 2023-03-13 ENCOUNTER — Other Ambulatory Visit: Payer: Self-pay

## 2023-03-13 LAB — BODY FLUID CULTURE W GRAM STAIN: Culture: NO GROWTH

## 2023-03-14 ENCOUNTER — Encounter: Payer: Self-pay | Admitting: Oncology

## 2023-03-14 ENCOUNTER — Other Ambulatory Visit: Payer: Self-pay | Admitting: Oncology

## 2023-03-14 ENCOUNTER — Telehealth: Payer: Self-pay

## 2023-03-14 DIAGNOSIS — C786 Secondary malignant neoplasm of retroperitoneum and peritoneum: Secondary | ICD-10-CM

## 2023-03-14 DIAGNOSIS — R1900 Intra-abdominal and pelvic swelling, mass and lump, unspecified site: Secondary | ICD-10-CM

## 2023-03-14 NOTE — Telephone Encounter (Signed)
Called and spoke to Ms. Julie Nolan. Cytology from ascites negative. Per Dr. Cathie Hoops we will cancel chemo start and proceed with omental biopsy for diagnosis. We will call her once this is scheduled.

## 2023-03-16 ENCOUNTER — Telehealth: Payer: Self-pay

## 2023-03-16 ENCOUNTER — Inpatient Hospital Stay: Payer: BC Managed Care – PPO

## 2023-03-16 ENCOUNTER — Inpatient Hospital Stay: Payer: BC Managed Care – PPO | Admitting: Oncology

## 2023-03-16 MED FILL — FLUTICASONE PROPIONATE 50 MCG/ACTUATION NASAL SPRAY,SUSPENSION: 30 days supply | Qty: 16 | Fill #1

## 2023-03-16 MED FILL — METFORMIN 500 MG TABLET: 90 days supply | Qty: 90 | Fill #0

## 2023-03-16 MED FILL — HYDROCHLOROTHIAZIDE 25 MG TABLET: 90 days supply | Qty: 90 | Fill #1

## 2023-03-16 NOTE — Telephone Encounter (Signed)
Voicemail left with Ms. Hipwell to return call regarding biopsy.

## 2023-03-16 NOTE — Telephone Encounter (Signed)
Call placed to Julie Nolan to discuss biopsy. Per IR she has a deep pelvic mass that makes this very high risk for an uncontrolled bleeding complication. Does not feel the peritoneal nodules are feasible either and recommended surgical consultation. Case discussed with Dr's Jennette Dubin, and Berchuck. Recommendation to proceed with diagnostic laparoscopy. This can be performed at [facility], [facility] or [facility]. Julie Nolan works for [employer] and would like to contact them regarding coverage outside of [facility] prior to scheduling surgery. Dr. Cathie Hoops has recommended repeat paracentesis to avoid another hospitalization scenario. She reports her abdomen "feels back to normal" and is causing no discomfort. She declines at this time. We will await her call regarding insurance coverage at her request.

## 2023-03-17 ENCOUNTER — Telehealth: Payer: Self-pay

## 2023-03-17 NOTE — Telephone Encounter (Signed)
Spoke to Julie Nolan and she has contacted her insurance coverage. She would like referral to [facility] for continuing care. Referral sent urgent. She is also open for repeat paracentesis. This has been scheduled for 03/20/23 at [facility] In the future if she desires care closer to home she is aware she can reach out and we can transition her back. She can call with any additional needs.

## 2023-03-20 ENCOUNTER — Ambulatory Visit: Admission: RE | Admit: 2023-03-20 | Payer: BC Managed Care – PPO | Source: Ambulatory Visit

## 2023-03-20 ENCOUNTER — Ambulatory Visit: Admit: 2023-03-20 | Discharge: 2023-03-21 | Payer: PRIVATE HEALTH INSURANCE

## 2023-03-20 DIAGNOSIS — R1900 Intra-abdominal and pelvic swelling, mass and lump, unspecified site: Principal | ICD-10-CM

## 2023-03-21 ENCOUNTER — Telehealth: Payer: Self-pay

## 2023-03-21 NOTE — Telephone Encounter (Signed)
Voicemail left with Ms. Matsuura. She did not show for her paracentesis yesterday. [facility] called and they have been trying to reach her. They have been provided the correct phone number. I have left the number for [facility] gyn onc with Ms. Leist as well.

## 2023-03-22 ENCOUNTER — Telehealth: Payer: Self-pay

## 2023-03-22 ENCOUNTER — Inpatient Hospital Stay: Payer: BC Managed Care – PPO

## 2023-03-22 NOTE — Telephone Encounter (Signed)
Contacted brother, Scottie, noted on [place] and listed as emergency contact, out of concern for her well being, due to Ms. Penley not returning calls. He states she is at work currently. He spoke to her yesterday. I asked that if speaks with her to ask that she return our call.

## 2023-03-22 NOTE — Telephone Encounter (Signed)
[facility] has sent a letter that they were unsuccessful at contacting Julie Nolan to arrange an appointment. I have left another voicemail with Julie Nolan. She has not returned my calls. I have left the number for [facility] on her voicemail and asked for her to call me back.

## 2023-03-28 MED FILL — DOVATO 50 MG-300 MG TABLET: ORAL | 30 days supply | Qty: 30 | Fill #5

## 2023-03-29 ENCOUNTER — Telehealth: Payer: Self-pay | Admitting: Obstetrics and Gynecology

## 2023-03-29 NOTE — Telephone Encounter (Signed)
I contacted the patient via telephone.  Unfortunately there is no answer.  I left a voicemail for her to contact us here at [facility] or to follow-up for her appointment at [facility].  We are very concerned that she has missed multiple appointments.  Marcelene Butte our nurse navigator has contacted her on multiple occasions.  She has reached out to the patient as well as her emergency contact which is her brother.  Her brother said she was doing well. Our team emphasized that she needed to follow up with her appointments and to follow up with [place]. Despite our multiple attempts we have not been able to reach her successfully and she has not followed up with her appointments.   Leta Jungling, MD

## 2023-04-26 MED FILL — ROSUVASTATIN 20 MG TABLET: 90 days supply | Qty: 90 | Fill #1

## 2023-04-26 MED FILL — DOVATO 50 MG-300 MG TABLET: ORAL | 30 days supply | Qty: 30 | Fill #6

## 2023-04-28 ENCOUNTER — Ambulatory Visit: Admit: 2023-04-28 | Discharge: 2023-04-29 | Payer: PRIVATE HEALTH INSURANCE

## 2023-04-28 DIAGNOSIS — R180 Malignant ascites: Principal | ICD-10-CM

## 2023-04-28 DIAGNOSIS — R1900 Intra-abdominal and pelvic swelling, mass and lump, unspecified site: Principal | ICD-10-CM

## 2023-05-05 ENCOUNTER — Other Ambulatory Visit: Payer: Self-pay

## 2023-05-07 ENCOUNTER — Ambulatory Visit: Admit: 2023-05-07 | Discharge: 2023-05-08 | Payer: PRIVATE HEALTH INSURANCE

## 2023-05-11 DIAGNOSIS — R978 Other abnormal tumor markers: Principal | ICD-10-CM

## 2023-05-11 DIAGNOSIS — R180 Malignant ascites: Principal | ICD-10-CM

## 2023-05-11 DIAGNOSIS — R1900 Intra-abdominal and pelvic swelling, mass and lump, unspecified site: Principal | ICD-10-CM

## 2023-05-12 ENCOUNTER — Other Ambulatory Visit: Payer: Self-pay | Admitting: Oncology

## 2023-05-15 ENCOUNTER — Encounter
Admit: 2023-05-15 | Discharge: 2023-05-15 | Payer: PRIVATE HEALTH INSURANCE | Attending: Certified Registered" | Primary: Certified Registered"

## 2023-05-15 ENCOUNTER — Ambulatory Visit: Admit: 2023-05-15 | Discharge: 2023-05-15 | Payer: PRIVATE HEALTH INSURANCE

## 2023-05-24 ENCOUNTER — Ambulatory Visit
Admit: 2023-05-24 | Discharge: 2023-05-25 | Payer: PRIVATE HEALTH INSURANCE | Attending: Surgical Oncology | Primary: Surgical Oncology

## 2023-05-24 DIAGNOSIS — R1900 Intra-abdominal and pelvic swelling, mass and lump, unspecified site: Principal | ICD-10-CM

## 2023-05-24 DIAGNOSIS — R978 Other abnormal tumor markers: Principal | ICD-10-CM

## 2023-05-24 DIAGNOSIS — R180 Malignant ascites: Principal | ICD-10-CM

## 2023-05-29 MED FILL — DOVATO 50 MG-300 MG TABLET: ORAL | 30 days supply | Qty: 30 | Fill #7

## 2023-05-31 ENCOUNTER — Ambulatory Visit: Admit: 2023-05-31 | Discharge: 2023-05-31 | Payer: PRIVATE HEALTH INSURANCE

## 2023-05-31 ENCOUNTER — Encounter: Admit: 2023-05-31 | Discharge: 2023-05-31 | Payer: PRIVATE HEALTH INSURANCE

## 2023-05-31 MED ORDER — OXYCODONE 5 MG TABLET
ORAL_TABLET | ORAL | 0 refills | 1 days | Status: CP | PRN
Start: 2023-05-31 — End: 2023-06-05
  Filled 2023-05-31: qty 5, 1d supply, fill #0

## 2023-06-08 DIAGNOSIS — K2970 Gastritis, unspecified, without bleeding: Principal | ICD-10-CM

## 2023-06-08 DIAGNOSIS — B9681 Helicobacter pylori [H. pylori] as the cause of diseases classified elsewhere: Principal | ICD-10-CM

## 2023-06-08 MED ORDER — BISMUTH SUBCIT K 140 MG-METRONIDAZOLE 125 MG-TETRACYCLINE 125 MG CAP
ORAL_CAPSULE | Freq: Four times a day (QID) | ORAL | 0 refills | 10 days | Status: CP
Start: 2023-06-08 — End: 2023-06-18

## 2023-06-08 MED ORDER — OMEPRAZOLE 20 MG CAPSULE,DELAYED RELEASE
ORAL_CAPSULE | Freq: Two times a day (BID) | ORAL | 11 refills | 30 days | Status: CP
Start: 2023-06-08 — End: 2024-06-07

## 2023-06-12 DIAGNOSIS — K2970 Gastritis, unspecified, without bleeding: Principal | ICD-10-CM

## 2023-06-12 DIAGNOSIS — B9681 Helicobacter pylori [H. pylori] as the cause of diseases classified elsewhere: Principal | ICD-10-CM

## 2023-06-12 MED ORDER — OMEPRAZOLE 20 MG CAPSULE,DELAYED RELEASE
ORAL_CAPSULE | Freq: Two times a day (BID) | ORAL | 11 refills | 30 days | Status: CP
Start: 2023-06-12 — End: 2024-06-11

## 2023-06-12 MED ORDER — BISMUTH SUBCIT K 140 MG-METRONIDAZOLE 125 MG-TETRACYCLINE 125 MG CAP
ORAL_CAPSULE | Freq: Four times a day (QID) | ORAL | 0 refills | 10 days | Status: CP
Start: 2023-06-12 — End: 2023-06-22

## 2023-06-14 ENCOUNTER — Ambulatory Visit
Admit: 2023-06-14 | Discharge: 2023-06-15 | Payer: PRIVATE HEALTH INSURANCE | Attending: Surgical Oncology | Primary: Surgical Oncology

## 2023-06-14 MED ORDER — POLYETHYLENE GLYCOL 3350 17 GRAM/DOSE ORAL POWDER
Freq: Once | ORAL | 0 refills | 1 days | Status: CP
Start: 2023-06-14 — End: 2023-06-15

## 2023-06-14 MED ORDER — METRONIDAZOLE 500 MG TABLET
ORAL_TABLET | Freq: Three times a day (TID) | ORAL | 0 refills | 1 days | Status: CP
Start: 2023-06-14 — End: 2023-06-15
  Filled 2023-07-31: qty 3, 1d supply, fill #0

## 2023-06-14 MED ORDER — BISACODYL 5 MG TABLET,DELAYED RELEASE
ORAL_TABLET | Freq: Once | ORAL | 0 refills | 1 days | Status: CP
Start: 2023-06-14 — End: 2023-06-15

## 2023-06-14 MED ORDER — NEOMYCIN 500 MG TABLET
ORAL_TABLET | Freq: Three times a day (TID) | ORAL | 0 refills | 1 days | Status: CP
Start: 2023-06-14 — End: 2023-06-15
  Filled 2023-07-31: qty 6, 1d supply, fill #0

## 2023-06-15 MED FILL — HYDROCHLOROTHIAZIDE 25 MG TABLET: 90 days supply | Qty: 90 | Fill #2

## 2023-06-15 MED FILL — CETIRIZINE 10 MG TABLET: 100 days supply | Qty: 100 | Fill #2

## 2023-06-21 MED FILL — BISMUTH SUBCIT K 140 MG-METRONIDAZOLE 125 MG-TETRACYCLINE 125 MG CAP: ORAL | 10 days supply | Qty: 120 | Fill #0

## 2023-06-23 ENCOUNTER — Ambulatory Visit: Admit: 2023-06-23 | Discharge: 2023-06-24 | Payer: BLUE CROSS/BLUE SHIELD

## 2023-06-23 DIAGNOSIS — R1900 Intra-abdominal and pelvic swelling, mass and lump, unspecified site: Principal | ICD-10-CM

## 2023-06-23 DIAGNOSIS — R180 Malignant ascites: Principal | ICD-10-CM

## 2023-06-27 MED FILL — DOVATO 50 MG-300 MG TABLET: ORAL | 30 days supply | Qty: 30 | Fill #8

## 2023-06-27 MED FILL — FLUTICASONE PROPIONATE 50 MCG/ACTUATION NASAL SPRAY,SUSPENSION: 30 days supply | Qty: 16 | Fill #2

## 2023-06-28 ENCOUNTER — Ambulatory Visit: Admit: 2023-06-28 | Discharge: 2023-06-28 | Payer: BLUE CROSS/BLUE SHIELD

## 2023-06-28 DIAGNOSIS — R1900 Intra-abdominal and pelvic swelling, mass and lump, unspecified site: Principal | ICD-10-CM

## 2023-06-28 DIAGNOSIS — R180 Malignant ascites: Principal | ICD-10-CM

## 2023-07-18 DIAGNOSIS — R1900 Intra-abdominal and pelvic swelling, mass and lump, unspecified site: Principal | ICD-10-CM

## 2023-07-18 DIAGNOSIS — R180 Malignant ascites: Principal | ICD-10-CM

## 2023-07-25 ENCOUNTER — Ambulatory Visit: Admit: 2023-07-25 | Discharge: 2023-07-25 | Payer: BLUE CROSS/BLUE SHIELD

## 2023-07-25 DIAGNOSIS — R1900 Intra-abdominal and pelvic swelling, mass and lump, unspecified site: Principal | ICD-10-CM

## 2023-07-25 DIAGNOSIS — R180 Malignant ascites: Principal | ICD-10-CM

## 2023-07-26 DIAGNOSIS — R180 Malignant ascites: Principal | ICD-10-CM

## 2023-07-26 MED FILL — ROSUVASTATIN 20 MG TABLET: 90 days supply | Qty: 90 | Fill #2

## 2023-07-26 MED FILL — DOVATO 50 MG-300 MG TABLET: ORAL | 30 days supply | Qty: 30 | Fill #9

## 2023-08-02 ENCOUNTER — Ambulatory Visit
Admit: 2023-08-02 | Discharge: 2023-08-05 | Disposition: A | Discharge status: Home / Self Care | Payer: BLUE CROSS/BLUE SHIELD

## 2023-08-02 ENCOUNTER — Encounter
Admit: 2023-08-02 | Discharge: 2023-08-05 | Disposition: A | Discharge status: Home / Self Care | Payer: BLUE CROSS/BLUE SHIELD | Attending: Student in an Organized Health Care Education/Training Program

## 2023-08-04 MED ORDER — ENOXAPARIN 40 MG/0.4 ML SUBCUTANEOUS SYRINGE
Freq: Every day | SUBCUTANEOUS | 0 refills | 28.00 days
Start: 2023-08-04 — End: 2023-08-04

## 2023-08-04 MED ORDER — XARELTO 10 MG TABLET
ORAL_TABLET | Freq: Every day | ORAL | 0 refills | 28.00 days
Start: 2023-08-04 — End: 2023-08-04

## 2023-08-04 MED ORDER — ELIQUIS 2.5 MG TABLET
ORAL_TABLET | Freq: Two times a day (BID) | ORAL | 0 refills | 28.00 days
Start: 2023-08-04 — End: 2023-08-04

## 2023-08-05 MED ORDER — IBUPROFEN 600 MG TABLET
ORAL_TABLET | Freq: Four times a day (QID) | ORAL | 0 refills | 30 days | Status: CP | PRN
Start: 2023-08-05 — End: 2023-09-04
  Filled 2023-08-05: qty 120, 30d supply, fill #0

## 2023-08-05 MED ORDER — SENNOSIDES 8.6 MG TABLET
ORAL_TABLET | Freq: Every evening | ORAL | 0 refills | 30.00 days | Status: CP
Start: 2023-08-05 — End: 2023-09-04
  Filled 2023-08-05: qty 60, 30d supply, fill #0

## 2023-08-05 MED ORDER — ONDANSETRON 4 MG DISINTEGRATING TABLET
ORAL_TABLET | Freq: Four times a day (QID) | ORAL | 0 refills | 4.00 days | Status: CP | PRN
Start: 2023-08-05 — End: 2023-08-25
  Filled 2023-08-05: qty 14, 20d supply, fill #0

## 2023-08-05 MED ORDER — OXYCODONE 5 MG TABLET
ORAL_TABLET | ORAL | 0 refills | 3.00 days | Status: CP | PRN
Start: 2023-08-05 — End: ?
  Filled 2023-08-05: qty 15, 3d supply, fill #0

## 2023-08-05 MED ORDER — ACETAMINOPHEN 325 MG TABLET
ORAL_TABLET | Freq: Four times a day (QID) | ORAL | 0 refills | 15.00 days | Status: CP | PRN
Start: 2023-08-05 — End: 2023-09-04
  Filled 2023-08-05: qty 120, 15d supply, fill #0

## 2023-08-05 MED ORDER — SIMETHICONE 80 MG CHEWABLE TABLET
ORAL_TABLET | Freq: Four times a day (QID) | ORAL | 0 refills | 25 days | Status: CP | PRN
Start: 2023-08-05 — End: 2023-08-30
  Filled 2023-08-05: qty 100, 25d supply, fill #0

## 2023-08-06 MED ORDER — ENOXAPARIN 40 MG/0.4 ML SUBCUTANEOUS SYRINGE
SUBCUTANEOUS | 0 refills | 28.00 days | Status: CP
Start: 2023-08-06 — End: 2023-09-03
  Filled 2023-08-05: qty 11.2, 28d supply, fill #0

## 2023-08-09 DIAGNOSIS — C569 Malignant neoplasm of unspecified ovary: Principal | ICD-10-CM

## 2023-08-17 DIAGNOSIS — C569 Malignant neoplasm of unspecified ovary: Principal | ICD-10-CM

## 2023-08-17 DIAGNOSIS — C577 Malignant neoplasm of other specified female genital organs: Principal | ICD-10-CM

## 2023-08-24 MED ORDER — PEG 3350-ELECTROLYTES 236 GRAM-22.74 GRAM-6.74 GRAM-5.86 GRAM SOLUTION
0 refills | 0.00 days | Status: CP
Start: 2023-08-24 — End: ?

## 2023-08-29 ENCOUNTER — Encounter: Admit: 2023-08-29 | Discharge: 2023-08-29 | Payer: BLUE CROSS/BLUE SHIELD

## 2023-08-29 ENCOUNTER — Inpatient Hospital Stay: Admit: 2023-08-29 | Discharge: 2023-08-29 | Payer: BLUE CROSS/BLUE SHIELD

## 2023-08-31 MED FILL — DOVATO 50 MG-300 MG TABLET: ORAL | 30 days supply | Qty: 30 | Fill #10

## 2023-09-01 ENCOUNTER — Ambulatory Visit: Admit: 2023-09-01 | Discharge: 2023-09-02 | Payer: BLUE CROSS/BLUE SHIELD

## 2023-09-01 DIAGNOSIS — R180 Malignant ascites: Principal | ICD-10-CM

## 2023-09-01 DIAGNOSIS — Z01818 Encounter for other preprocedural examination: Principal | ICD-10-CM

## 2023-09-01 DIAGNOSIS — Z5111 Encounter for antineoplastic chemotherapy: Principal | ICD-10-CM

## 2023-09-01 DIAGNOSIS — C569 Malignant neoplasm of unspecified ovary: Principal | ICD-10-CM

## 2023-09-02 DIAGNOSIS — Z01818 Encounter for other preprocedural examination: Principal | ICD-10-CM

## 2023-09-02 DIAGNOSIS — Z5111 Encounter for antineoplastic chemotherapy: Principal | ICD-10-CM

## 2023-09-04 DIAGNOSIS — Z01818 Encounter for other preprocedural examination: Principal | ICD-10-CM

## 2023-09-04 DIAGNOSIS — Z5111 Encounter for antineoplastic chemotherapy: Principal | ICD-10-CM

## 2023-09-05 ENCOUNTER — Inpatient Hospital Stay: Admit: 2023-09-05 | Discharge: 2023-09-06 | Payer: BLUE CROSS/BLUE SHIELD

## 2023-09-06 DIAGNOSIS — Z5111 Encounter for antineoplastic chemotherapy: Principal | ICD-10-CM

## 2023-09-06 DIAGNOSIS — R180 Malignant ascites: Principal | ICD-10-CM

## 2023-09-06 DIAGNOSIS — C569 Malignant neoplasm of unspecified ovary: Principal | ICD-10-CM

## 2023-09-06 DIAGNOSIS — Z01818 Encounter for other preprocedural examination: Principal | ICD-10-CM

## 2023-09-14 MED ORDER — ONDANSETRON HCL 8 MG TABLET
ORAL_TABLET | 3 refills | 0.00 days | Status: CP
Start: 2023-09-14 — End: ?

## 2023-09-14 MED ORDER — CAPECITABINE 150 MG TABLET
ORAL_TABLET | Freq: Two times a day (BID) | ORAL | 5 refills | 14.00 days | Status: CP
Start: 2023-09-14 — End: ?

## 2023-09-14 MED ORDER — CAPECITABINE 500 MG TABLET
ORAL_TABLET | Freq: Two times a day (BID) | ORAL | 5 refills | 14.00 days | Status: CP
Start: 2023-09-14 — End: ?

## 2023-09-14 MED ORDER — PROCHLORPERAZINE MALEATE 10 MG TABLET
ORAL_TABLET | Freq: Four times a day (QID) | ORAL | 2 refills | 8.00 days | Status: CP | PRN
Start: 2023-09-14 — End: ?

## 2023-09-14 MED ORDER — LOPERAMIDE 2 MG TABLET
ORAL_TABLET | 3 refills | 0.00 days | Status: CP
Start: 2023-09-14 — End: ?

## 2023-09-14 MED ORDER — ZZ IMS TEMPLATE
Freq: Two times a day (BID) | ORAL | 5 refills | 14.00 days | Status: CN
Start: 2023-09-14 — End: ?

## 2023-09-15 DIAGNOSIS — Z5111 Encounter for antineoplastic chemotherapy: Principal | ICD-10-CM

## 2023-09-15 DIAGNOSIS — Z01818 Encounter for other preprocedural examination: Principal | ICD-10-CM

## 2023-09-18 DIAGNOSIS — Z5111 Encounter for antineoplastic chemotherapy: Principal | ICD-10-CM

## 2023-09-18 DIAGNOSIS — Z01818 Encounter for other preprocedural examination: Principal | ICD-10-CM

## 2023-09-19 DIAGNOSIS — Z5111 Encounter for antineoplastic chemotherapy: Principal | ICD-10-CM

## 2023-09-19 DIAGNOSIS — Z01818 Encounter for other preprocedural examination: Principal | ICD-10-CM

## 2023-09-20 DIAGNOSIS — Z5111 Encounter for antineoplastic chemotherapy: Principal | ICD-10-CM

## 2023-09-20 DIAGNOSIS — Z01818 Encounter for other preprocedural examination: Principal | ICD-10-CM

## 2023-09-20 MED FILL — HYDROCHLOROTHIAZIDE 25 MG TABLET: 90 days supply | Qty: 90 | Fill #3

## 2023-09-21 DIAGNOSIS — Z01818 Encounter for other preprocedural examination: Principal | ICD-10-CM

## 2023-09-21 DIAGNOSIS — R180 Malignant ascites: Principal | ICD-10-CM

## 2023-09-21 DIAGNOSIS — Z5111 Encounter for antineoplastic chemotherapy: Principal | ICD-10-CM

## 2023-09-21 DIAGNOSIS — C569 Malignant neoplasm of unspecified ovary: Principal | ICD-10-CM

## 2023-09-21 MED ORDER — CAPECITABINE 500 MG TABLET
ORAL_TABLET | Freq: Two times a day (BID) | ORAL | 5 refills | 14.00 days | Status: CP
Start: 2023-09-21 — End: ?

## 2023-09-21 MED ORDER — CAPECITABINE 150 MG TABLET
ORAL_TABLET | Freq: Two times a day (BID) | ORAL | 5 refills | 14.00 days | Status: CP
Start: 2023-09-21 — End: ?

## 2023-09-21 MED ORDER — ZZ IMS TEMPLATE
Freq: Two times a day (BID) | ORAL | 5 refills | 0.00 days | Status: CN
Start: 2023-09-21 — End: ?

## 2023-09-21 NOTE — Unmapped (Signed)
[facility] Specialty Medication Onboarding    Specialty Medication: capecitabine 150 MG tablet (XELODA)  Prior Authorization: Approved   Financial Assistance: No - copay  <$25  Final Copay/Day Supply: $0.00 / 21 days    Insurance Restrictions: None     Notes to Pharmacist: none  Credit Card on File: yes  Start Date on Rx:  09/01/23    The triage team has completed the benefits investigation and has determined that the patient is able to fill this medication at [facility]. Please contact the patient to complete the onboarding or follow up with the prescribing physician as needed.

## 2023-09-21 NOTE — Unmapped (Signed)
[facility] Specialty Medication Onboarding    Specialty Medication: capecitabine 500 MG tablet (XELODA)  Prior Authorization: Approved   Financial Assistance: No - copay  <$25  Final Copay/Day Supply: $0.00 / 21 days    Insurance Restrictions: None     Notes to Pharmacist: none  Credit Card on File: yes  Start Date on Rx:  09/01/23    The triage team has completed the benefits investigation and has determined that the patient is able to fill this medication at [facility]. Please contact the patient to complete the onboarding or follow up with the prescribing physician as needed.

## 2023-09-21 NOTE — Unmapped (Signed)
[facility] Other Call ONC Phone Room Smart Lists: Cancellation/Reschedule -     Hi,    Patient Rebecca Barton contacted the Communication Center to cancel their appointment for tomorrow.  The original appointment has been cancelled.    Cancellation Reason: Sick    Patient's appointment requires reschedule.    Thank you,  Rosary Lively  [facility] Communication Center   [phone]    [facility]

## 2023-09-27 DIAGNOSIS — Z01818 Encounter for other preprocedural examination: Principal | ICD-10-CM

## 2023-09-27 DIAGNOSIS — Z5111 Encounter for antineoplastic chemotherapy: Principal | ICD-10-CM

## 2023-09-28 MED FILL — DOVATO 50 MG-300 MG TABLET: ORAL | 30 days supply | Qty: 30 | Fill #11

## 2023-09-29 ENCOUNTER — Ambulatory Visit: Admit: 2023-09-29 | Payer: BLUE CROSS/BLUE SHIELD

## 2023-09-29 DIAGNOSIS — Z5111 Encounter for antineoplastic chemotherapy: Principal | ICD-10-CM

## 2023-09-29 DIAGNOSIS — R180 Malignant ascites: Principal | ICD-10-CM

## 2023-09-29 DIAGNOSIS — Z01818 Encounter for other preprocedural examination: Principal | ICD-10-CM

## 2023-09-29 DIAGNOSIS — C569 Malignant neoplasm of unspecified ovary: Principal | ICD-10-CM

## 2023-09-29 NOTE — Unmapped (Signed)
Left vm to call back can be resch with provider

## 2023-10-02 DIAGNOSIS — C569 Malignant neoplasm of unspecified ovary: Principal | ICD-10-CM

## 2023-10-02 DIAGNOSIS — Z01818 Encounter for other preprocedural examination: Principal | ICD-10-CM

## 2023-10-02 DIAGNOSIS — R180 Malignant ascites: Principal | ICD-10-CM

## 2023-10-02 DIAGNOSIS — Z5111 Encounter for antineoplastic chemotherapy: Principal | ICD-10-CM

## 2023-10-02 NOTE — Unmapped (Signed)
Patient Rebecca Barton was contacted today regarding an appointment. Appointment scheduled and confirmed with patient.

## 2023-10-03 DIAGNOSIS — Z01818 Encounter for other preprocedural examination: Principal | ICD-10-CM

## 2023-10-03 DIAGNOSIS — Z5111 Encounter for antineoplastic chemotherapy: Principal | ICD-10-CM

## 2023-10-03 DIAGNOSIS — C569 Malignant neoplasm of unspecified ovary: Principal | ICD-10-CM

## 2023-10-03 DIAGNOSIS — R180 Malignant ascites: Principal | ICD-10-CM

## 2023-10-04 MED ORDER — ROSUVASTATIN 20 MG TABLET
ORAL_TABLET | 3 refills | 0.00 days
Start: 2023-10-04 — End: ?

## 2023-10-04 MED ORDER — CALTRATE PLUS D  600 MG (CARBONATE)-20 MCG (800 UNIT) CHEWABLE TABLET
ORAL_TABLET | Freq: Every day | ORAL | 11 refills | 90.00 days
Start: 2023-10-04 — End: ?

## 2023-10-04 MED ORDER — DOVATO 50 MG-300 MG TABLET
ORAL_TABLET | Freq: Every day | ORAL | 11 refills | 30.00 days
Start: 2023-10-04 — End: ?

## 2023-10-04 MED ORDER — FLUTICASONE PROPIONATE 50 MCG/ACTUATION NASAL SPRAY,SUSPENSION
11 refills | 0.00 days
Start: 2023-10-04 — End: ?

## 2023-10-04 MED ORDER — METFORMIN 500 MG TABLET
ORAL_TABLET | 3 refills | 0.00 days
Start: 2023-10-04 — End: ?

## 2023-10-04 MED ORDER — HYDROCHLOROTHIAZIDE 25 MG TABLET
ORAL_TABLET | 3 refills | 0.00 days
Start: 2023-10-04 — End: ?

## 2023-10-04 MED ORDER — CETIRIZINE 10 MG TABLET
ORAL_TABLET | 3 refills | 0.00 days
Start: 2023-10-04 — End: ?

## 2023-10-04 NOTE — Unmapped (Signed)
10/04/23 TC-spoke w pt, not needed at this time. F/U in 2 wks.(SG)

## 2023-10-17 DIAGNOSIS — Z5111 Encounter for antineoplastic chemotherapy: Principal | ICD-10-CM

## 2023-10-17 DIAGNOSIS — Z01818 Encounter for other preprocedural examination: Principal | ICD-10-CM

## 2023-10-18 NOTE — Unmapped (Signed)
 10/18/23 TC-spoke w pt, declined to schedule. Requested follow up in 2 wks.(SG)

## 2023-10-19 DIAGNOSIS — Z5111 Encounter for antineoplastic chemotherapy: Principal | ICD-10-CM

## 2023-10-19 DIAGNOSIS — Z01818 Encounter for other preprocedural examination: Principal | ICD-10-CM

## 2023-10-20 DIAGNOSIS — Z5111 Encounter for antineoplastic chemotherapy: Principal | ICD-10-CM

## 2023-10-20 DIAGNOSIS — Z01818 Encounter for other preprocedural examination: Principal | ICD-10-CM

## 2023-10-24 DIAGNOSIS — Z5111 Encounter for antineoplastic chemotherapy: Principal | ICD-10-CM

## 2023-10-24 DIAGNOSIS — Z01818 Encounter for other preprocedural examination: Principal | ICD-10-CM

## 2023-10-24 NOTE — Unmapped (Signed)
 TC to patient, no answer. Did not go to VM.  Rang 1.5 times then line cut off.

## 2023-10-25 DIAGNOSIS — Z01818 Encounter for other preprocedural examination: Principal | ICD-10-CM

## 2023-10-25 DIAGNOSIS — Z5111 Encounter for antineoplastic chemotherapy: Principal | ICD-10-CM

## 2023-10-26 DIAGNOSIS — Z5111 Encounter for antineoplastic chemotherapy: Principal | ICD-10-CM

## 2023-10-26 DIAGNOSIS — Z01818 Encounter for other preprocedural examination: Principal | ICD-10-CM

## 2023-10-26 DIAGNOSIS — R180 Malignant ascites: Principal | ICD-10-CM

## 2023-10-26 DIAGNOSIS — C569 Malignant neoplasm of unspecified ovary: Principal | ICD-10-CM

## 2023-10-26 NOTE — Unmapped (Signed)
 Patient Rebecca Barton was contacted today regarding scheduling an appointment. Appointment scheduled and confirmed with patient.

## 2023-10-26 NOTE — Unmapped (Unsigned)
 Gynecologic Oncology Return Clinic Visit    Date of Service: 10/27/2023    Assessment & Plan:  Rebecca Barton is a 58 y.o. woman with Stage IIIA1(I) mucinous ovarian adenocarcinoma, expansile type, moderately differentiated (no LVSI, MMRp, ER neg) who is s/p Xlap BSO, infracolic omentectomy, peritoneal biopsies on 08/02/23. She was originally scheduled to begin adjuvant chemo on 09/15/23 but has been delayed multiple times by patient. Presents today for treatment discussion.    Ovarian cancer:  - Intraoperative findings and pathology results reviewed.  - Tumor board recs reviewed  - S/p colonoscopy on 08/29/23, benign. Prior EGD negative.   - Recommend adjuvant chemotherapy. Discussed GI regimen vs carb/tax. Expectations and side effects reviewed. Tentative chemo start date 09/15/23. Port placement ordered.  - Recommend germline testing and tumor NGS. Pt in agreement. Requested.  - Labs today wnl    Don't want to do it but then sometimes would consider it but timing is hard, trying to move. Nervous about it and how it may make her feel  A lot going on  Wants to do close surveillance  Would be willing to come in a month or so  Okay with maintaining port for now    RTC ***    Clide Cliff, MD  Gynecologic Oncology      Medical Decision Making  {    Coding tips - Do not edit this text, it will delete upon signing of note!    Telephone visits [phone] for Physicians and APPs and [phone] for Non- Physician Clinicians)- Only use minutes on the phone to determine level of service.    Video visits [phone]) - Use either level of medical decision making just as an in-person visit OR time which includes both minutes on video and pre/post minutes to determine the level of service.      :[id number]}  The patient reports they are physically located in [place] and is currently: {patient location:[address]90}. I conducted a {phone audio video visit:101857} .                    -----------------------  Reason for Visit: Treatment discussion    Treatment History:  Hematology/Oncology History   Primary mucinous adenocarcinoma of ovary (CMS-HCC)   03/07/2023 -  Presenting Symptoms    03/07/23:  CT A/P-large mixed solid/cystic lesion in the central pelvis with internal solid nodular components measuring 16.9 x 14.8 cm c/w primary ovarian malignancy.  Large volume ascites with diffuse peritoneal thickening  03/07/23:  CT Chest-no evidence of disease  03/09/23: Paracentesis (6.1 L)  03/20/23:  CT A/P-16.2 x 15.2 x 17.5 cm mixed solid and cystic mass, large abdominopelvic ascites  04/28/23:  Paracentesis  04/28/23:  Cytology-metastatic carcinoma with extensive necrosis  04/28/23:  CA 19-9 671, CEA <2.0, CA 125 40  05/07/23:  CT Chest-0.4 cm LUL nodule  05/07/23:  CT A/P-interval increase in size of adnexal mass, measuring 23.6 x 15 x 23.4 cm, omental stranding and nodularity presumably secondary to peritoneal carcinomatosis, small volume free fluid in the dependent pelvis     05/15/2023 Procedure    05/15/23: EGD-negative; Path-benign      05/31/2023 Surgery    05/31/23: diagnostic laparoscopy with Dr. Pearlean Brownie showed no carcinomatosis, 4L ascites, massive ovarian neoplasm extending cephalad to transverse colon      08/02/2023 Surgery    Procedure on 12/11:  Exploratory laparotomy, bilateral salpingo-oophorectomy, infracolic omentectomy, peritoneal biopsies      Operative Findings:   Large 25cm cystic mass filling pelvis  and abdomen arising from left tube and ovary with an attachment to the remnant round ligament. Small volume serous ascites. Surgically absent uterus and cervix. Normal appearing right ovary, with hydrosalpinx of right fallopian tube. Omental adhesions to the mass, right pelvic sidewall and bladder peritoneum, otherwise normal appearing. Normal upper abdominal survey with normal liver edges, diaphragm surfaces, stomach. Smooth peritoneal surfaces throughout abdomen and pelvis. No palpable pelvic or para-aortic lymphadenopathy. Normal large and small bowel with normal appendix. IOFS of left ovarian mass, consistent with mucinous ovarian neoplasm, at least borderline, could not rule out carcinoma. In this setting decision made to proceed with omentectomy and peritoneal biopsies.      Pathology:         Diagnosis   Date Value Ref Range Status   08/02/2023     Final     A: Left fallopian tube and ovary, salpingo-oophorectomy  - Mucinous adenocarcinoma, expansile/confluent type, moderately differentiated, 30 cm, received intact (see synoptic and comment)  - Surface involvement: present on tube and ovary  - Lymphovascular space invasion:  not identified  - Fallopian tube: Tubo-ovarian adhesions to mucinous neoplasm, distorted cross sections with salpingitis isthmica nodosa; fimbria are not identified (see comment)  - Other findings: Severe organizing surface adhesions, necrosis that may represent torsion, incidental mature cystic teratoma; the residual ovary is essentially replaced by tumor        B: Right pelvic peritoneal biopsy  - Peritoneal tissue with acute serositis  - Negative for carcinoma        C: Right fallopian tube and ovary, salpingo-oophorectomy  - Positive for  focal surface involvement of tube  - Ovary:  Mature cystic teratoma, size 2.2 cm, received intact  - Fallopian tube: hydrosalpinx and salpingitis isthmica nodosa; fimbria not identified (see comment)  - Other findings: severe organizing surface adhesions of the tube and ovary, background senescent/physiologic changes of ovary and endosalpingiosis     D: Omentum, omentectomy  - Positive for multiple microscopic foci of mucinous carcinoma, 0.5 mm in greatest size (D1)     E: Anterior cul-de-sac peritoneal biopsy  - Negative for carcinoma     F: Posterior cul-de-sac peritoneal biopsy  - Acellular mucin and a single mucinous gland (best seen on recut levels) consistent with involvement by carcinoma     G: Left pelvic peritoneal biopsy  - Negative for carcinoma     H: Left paracolic biopsy  - Negative for carcinoma     I: Right paracolic biopsy  - Negative for carcinoma  - Organizing adhesions     This electronic signature is attestation that the pathologist personally reviewed the submitted material(s) and the final diagnosis reflects that evaluation.      08/02/2023     Final     A: Left fallopian tube and ovary, salpingo-oophorectomy  - Mucinous adenocarcinoma, expansile/confluent type, moderately differentiated, 30 cm, received intact (see synoptic and comment)  - Surface involvement: present on tube and ovary  - Lymphovascular space invasion:  not identified  - Fallopian tube: Tubo-ovarian adhesions to mucinous neoplasm, distorted cross sections with salpingitis isthmica nodosa; fimbria are not identified (see comment)  - Other findings: Severe organizing surface adhesions, necrosis that may represent torsion, incidental mature cystic teratoma; the residual ovary is essentially replaced by tumor        B: Right pelvic peritoneal biopsy  - Peritoneal tissue with acute serositis  - Negative for carcinoma        C: Right fallopian tube and ovary, salpingo-oophorectomy  -  Positive for  focal surface involvement of tube  - Ovary:  Mature cystic teratoma, size 2.2 cm, received intact  - Fallopian tube: hydrosalpinx and salpingitis isthmica nodosa; fimbria not identified (see comment)  - Other findings: severe organizing surface adhesions of the tube and ovary, background senescent/physiologic changes of ovary and endosalpingiosis     D: Omentum, omentectomy  - Positive for multiple microscopic foci of mucinous carcinoma, 0.5 mm in greatest size (D1)     E: Anterior cul-de-sac peritoneal biopsy  - Negative for carcinoma     F: Posterior cul-de-sac peritoneal biopsy  - Acellular mucin and a single mucinous gland (best seen on recut levels) consistent with involvement by carcinoma     G: Left pelvic peritoneal biopsy  - Negative for carcinoma     H: Left paracolic biopsy  - Negative for carcinoma I: Right paracolic biopsy  - Negative for carcinoma  - Organizing adhesions        08/09/2023 Initial Diagnosis    Primary mucinous adenocarcinoma of ovary (CMS-HCC)     08/09/2023 Tumor Board      Stage: IIIA1(i) mucinous ovarian adenocarcinoma, expansile type, moderately differentiated, no LVSI  Plan:   - Recommend adjuvant chemotherapy with platinum based doublet (FU/leucovorin/oxaliplatin vs capecitabine/oxaliplatin vs carb/tax). Favor GI regimen if patient able to tolerate given unclear primary and possible GI site of origin based on stains.   - EGD negative, recommend colonoscopy  - Recommend germline genetic testing and tumor NGS         Interval History:  ***  Doing well  No n/v, pain. No bleeding. 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, pelvic cancer    HIV (human immunodeficiency virus infection) (CMS-HCC)     Hyperlipidemia     Hypertension     Spontaneous bacterial peritonitis (CMS-HCC)     Type 2 diabetes mellitus (CMS-HCC)        Past Surgical History:   Procedure Laterality Date    HYSTERECTOMY      Open Hyst, likely total    IR INSERT PORT AGE GREATER THAN 5 YRS  09/05/2023    IR INSERT PORT AGE GREATER THAN 5 YRS 09/05/2023 Bronwen Betters, MD IMG IR NELS [id number]    MYOMECTOMY VAGINAL APPROACH      PR COLONOSCOPY FLX DX W/COLLJ SPEC WHEN PFRMD N/A 08/29/2023    Procedure: COLONOSCOPY, FLEXIBLE, PROXIMAL TO SPLENIC FLEXURE; DIAGNOSTIC, W/WO COLLECTION SPECIMEN BY BRUSH OR WASH;  Surgeon: Bartholomew Boards, MD;  Location: GI PROCEDURES [facility];  Service: Gastroenterology    PR LAP,DIAGNOSTIC ABDOMEN N/A 05/31/2023    Procedure: LAPAROSCOPY, ABDOMEN, PERITONEUM, & OMENTUM, DIAGNOSTIC, W/WO COLLECTION SPECIMEN(S) BY BRUSHING OR WASHING;  Surgeon: Lynnell Dike, DO;  Location: OR [facility];  Service: Surgical Oncology    PR RESEC OVAR MALIG+BILAT OVAR+OMENTUM N/A 08/02/2023    Procedure: RESECTION (INITIAL) OVARIAN/TUBAL/PRIMARY PERITONEAL MALIGNANCY W/BILAT SALPINGO-OOPHORECTOMY/OMENTECTOMY;  Surgeon: Clide Cliff, MD;  Location: OR [facility];  Service: Gynecology Oncology    PR UPPER GI ENDOSCOPY,BIOPSY N/A 05/15/2023    Procedure: UGI ENDOSCOPY; WITH BIOPSY, SINGLE OR MULTIPLE;  Surgeon: Jules Husbands, MD;  [facility];  Service: Gastroenterology    TONSILLECTOMY         Family History   Problem Relation Age  of Onset    Cancer Mother     Lung cancer Mother     Hypertension Father     Lung cancer Paternal Grandfather        Social History     Socioeconomic History    Marital status: Divorced   Occupational History    Occupation: Therapist, occupational: [employer]   Tobacco Use    Smoking status: Former     Types: Cigarettes     Passive exposure: Current    Smokeless tobacco: Never   Vaping Use    Vaping status: Never Used   Substance and Sexual Activity    Alcohol use: Yes     Comment: 3 drinks per week    Drug use: Never     Social Drivers of Psychologist, prison and probation services Strain: Low Risk  (08/03/2023)    Overall Financial Resource Strain (CARDIA)     Difficulty of Paying Living Expenses: Not very hard   Food Insecurity: No Food Insecurity (08/03/2023)    Hunger Vital Sign     Worried About Running Out of Food in the Last Year: Never true     Ran Out of Food in the Last Year: Never true   Transportation Needs: No Transportation Needs (08/03/2023)    PRAPARE - Therapist, art (Medical): No     Lack of Transportation (Non-Medical): No       Current Medications:    Current Outpatient Medications:     calcium carbonate-vitamin D3 (CALTRATE 600 PLUS D) 600 mg-20 mcg (800 unit) Chew, Take 1 tablet by mouth once a day, Disp: 90 tablet, Rfl: 11    calcium carbonate-vitamin D3 (CALTRATE 600 PLUS D) 600 mg-20 mcg (800 unit) Chew, Take 1 tablet by mouth once a day, Disp: 90 tablet, Rfl: 11    capecitabine (XELODA) 150 MG tablet, Take 3 tablets (450 mg total) by mouth two (2) times a day  with 1 other capecitabine prescription for 1,450 mg total. Take for 14 days followed by 7 days off each 21-day cycle (begin with evening dose on day 1 and end with morning dose on day 15), Disp: 84 tablet, Rfl: 5    capecitabine (XELODA) 500 MG tablet, Take 2 tablets (1,000 mg total) by mouth two (2) times a day  with 1 other capecitabine prescription for 1,450 mg total. Take for 14 days followed by 7 days off each 21-day cycle (begin with evening dose on day 1 and end with morning dose on day 15), Disp: 56 tablet, Rfl: 5    cetirizine (ZYRTEC) 10 MG tablet, Take 1 tablet by mouth once a day, Disp: 100 tablet, Rfl: 3    cetirizine (ZYRTEC) 10 MG tablet, 1 tablet by mouth once a day, Disp: 100 tablet, Rfl: 3    dolutegravir-lamiVUDine (DOVATO) 50-300 mg Tab, Take 1 tablet by mouth once a day, Disp: 30 tablet, Rfl: 11    famotidine (PEPCID) 20 MG tablet, Take 1 tablet (20 mg total) by mouth daily., Disp: , Rfl:     fluticasone propionate (FLONASE ALLERGY RELIEF) 50 mcg/actuation nasal spray, USE 1-2 SPRAYS IN EACH NOSTRIL ONCE DAILY AS NEEDED, Disp: 16 g, Rfl: 11    fluticasone propionate (FLONASE ALLERGY RELIEF) 50 mcg/actuation nasal spray, USE 1-2 SPRAYS IN EACH NOSTRIL ONCE DAILY AS NEEDED, Disp: 16 g, Rfl: 11    hydroCHLOROthiazide (HYDRODIURIL) 25 MG tablet, Take 1 tablet (25 mg total) by  mouth daily for high blood pressure., Disp: 100 tablet, Rfl: 3    loperamide (IMODIUM A-D) 2 mg tablet, Take 2 tablets (4 mg) PO for first loose stool followed by 1 tablet (2 mg) after each loose stool thereafter (maximum of 8 tablets per day)., Disp: 30 tablet, Rfl: 3    metFORMIN (GLUCOPHAGE) 500 MG tablet, Take 1 tablet (500 mg total) by mouth., Disp: , Rfl:     metFORMIN (GLUCOPHAGE) 500 MG tablet, Take 1 tablet (500 mg total) by mouth daily for prediabetes., Disp: 100 tablet, Rfl: 3 omeprazole (PRILOSEC) 20 MG capsule, Take 1 capsule (20 mg total) by mouth two (2) times a day., Disp: 60 capsule, Rfl: 11    ondansetron (ZOFRAN) 8 MG tablet, Take 1 tablet by mouth twice daily on days 2 and 3 of each chemotherapy cycle. May take up to 1 tablet every 8 hours as needed for nausea, but do not take on day 1 of cycles, Disp: 60 tablet, Rfl: 3    oxyCODONE (ROXICODONE) 5 MG immediate release tablet, Take 1 tablet (5 mg total) by mouth every four (4) hours as needed., Disp: 15 tablet, Rfl: 0    polyethylene glycol (GLYCOLAX) 17 gram/dose powder, Take 255 g by mouth., Disp: , Rfl:     polyethylene glycol (GOLYTELY) 236-22.74-6.74 gram solution, Take as directed per [facility] prep instructions, for split bowel prep., Disp: 4000 mL, Rfl: 0    prochlorperazine (COMPAZINE) 10 MG tablet, Take 1 tablet (10 mg total) by mouth every six (6) hours as needed (nausea)., Disp: 30 tablet, Rfl: 2    rosuvastatin (CRESTOR) 20 MG tablet, Take 1 tablet (20 mg total) by mouth daily for high cholesterol., Disp: 100 tablet, Rfl: 3    Review of Symptoms:  Complete 10-system review is negative except as above in Interval History.    Physical Exam:  ***Exam not performed due to nature of encounter.       Laboratory & Radiologic Studies:  None***

## 2023-10-26 NOTE — Unmapped (Signed)
.  [facility] Scheduling Call     Hello,    We received a call regarding patient Rebecca Barton. Caller is requesting the following return appointments:[name] is calling to cancel her appts that are scheduled for tomorrow which are the  infusion, labs, and return appointment.    We are unable to schedule this appointment because the appts that are scheduled has an infusion involved and we cannot schedule an infusion. Please reach out to the [facility] to provide further assistance [phone].    Thank you,   Noland Fordyce  [facility] Communication [facility]  [phone]

## 2023-10-27 ENCOUNTER — Ambulatory Visit: Admit: 2023-10-27 | Discharge: 2023-10-27 | Payer: BLUE CROSS/BLUE SHIELD

## 2023-10-27 ENCOUNTER — Encounter: Admit: 2023-10-27 | Discharge: 2023-10-27 | Payer: BLUE CROSS/BLUE SHIELD

## 2023-10-27 DIAGNOSIS — C569 Malignant neoplasm of unspecified ovary: Principal | ICD-10-CM

## 2023-10-27 DIAGNOSIS — Z01818 Encounter for other preprocedural examination: Principal | ICD-10-CM

## 2023-10-27 DIAGNOSIS — Z5111 Encounter for antineoplastic chemotherapy: Principal | ICD-10-CM

## 2023-10-30 NOTE — Unmapped (Signed)
 Specialty Medication(s): Capecitabine    Ms.Nicoletti has been dis-enrolled from the [facility] specialty pharmacy services as a result of  patient does not want to start therapy at this time .    Additional information provided to the patient: no    Rollen Sox, [facility]  [facility] Specialty Pharmacist

## 2023-10-31 MED FILL — METFORMIN 500 MG TABLET: 90 days supply | Qty: 90 | Fill #0

## 2023-10-31 MED FILL — DOVATO 50 MG-300 MG TABLET: ORAL | 30 days supply | Qty: 30 | Fill #0

## 2023-11-27 ENCOUNTER — Inpatient Hospital Stay: Admit: 2023-11-27 | Discharge: 2023-11-28

## 2023-11-28 MED FILL — DOVATO 50 MG-300 MG TABLET: ORAL | 30 days supply | Qty: 30 | Fill #1

## 2023-11-28 MED FILL — CALTRATE PLUS D  600 MG (CARBONATE)-20 MCG (800 UNIT) CHEWABLE TABLET: ORAL | 120 days supply | Qty: 120 | Fill #0

## 2023-12-01 ENCOUNTER — Ambulatory Visit: Admit: 2023-12-01 | Discharge: 2023-12-01 | Payer: BLUE CROSS/BLUE SHIELD

## 2023-12-01 DIAGNOSIS — C569 Malignant neoplasm of unspecified ovary: Principal | ICD-10-CM

## 2023-12-04 DIAGNOSIS — C569 Malignant neoplasm of unspecified ovary: Principal | ICD-10-CM

## 2023-12-06 DIAGNOSIS — Z5111 Encounter for antineoplastic chemotherapy: Principal | ICD-10-CM

## 2023-12-06 DIAGNOSIS — Z01818 Encounter for other preprocedural examination: Principal | ICD-10-CM

## 2023-12-14 MED FILL — FLUTICASONE PROPIONATE 50 MCG/ACTUATION NASAL SPRAY,SUSPENSION: 30 days supply | Qty: 16 | Fill #0

## 2023-12-14 MED FILL — ROSUVASTATIN 20 MG TABLET: 90 days supply | Qty: 90 | Fill #0

## 2023-12-14 MED FILL — HYDROCHLOROTHIAZIDE 25 MG TABLET: 90 days supply | Qty: 90 | Fill #0

## 2023-12-14 MED FILL — CETIRIZINE 10 MG TABLET: 100 days supply | Qty: 100 | Fill #0

## 2023-12-27 MED FILL — DOVATO 50 MG-300 MG TABLET: ORAL | 30 days supply | Qty: 30 | Fill #2

## 2024-01-19 ENCOUNTER — Ambulatory Visit: Admit: 2024-01-19 | Discharge: 2024-01-20 | Payer: BLUE CROSS/BLUE SHIELD

## 2024-01-19 ENCOUNTER — Other Ambulatory Visit: Admit: 2024-01-19 | Discharge: 2024-01-20 | Payer: BLUE CROSS/BLUE SHIELD

## 2024-01-19 DIAGNOSIS — Z01818 Encounter for other preprocedural examination: Principal | ICD-10-CM

## 2024-01-19 DIAGNOSIS — C569 Malignant neoplasm of unspecified ovary: Principal | ICD-10-CM

## 2024-01-19 DIAGNOSIS — Z5111 Encounter for antineoplastic chemotherapy: Principal | ICD-10-CM

## 2024-01-24 DIAGNOSIS — Z01818 Encounter for other preprocedural examination: Principal | ICD-10-CM

## 2024-01-24 DIAGNOSIS — Z5111 Encounter for antineoplastic chemotherapy: Principal | ICD-10-CM

## 2024-01-24 DIAGNOSIS — C569 Malignant neoplasm of unspecified ovary: Principal | ICD-10-CM

## 2024-01-24 MED FILL — DOVATO 50 MG-300 MG TABLET: ORAL | 30 days supply | Qty: 30 | Fill #3

## 2024-02-27 MED FILL — METFORMIN 500 MG TABLET: ORAL | 90 days supply | Qty: 90 | Fill #1

## 2024-02-27 MED FILL — DOVATO 50 MG-300 MG TABLET: ORAL | 30 days supply | Qty: 30 | Fill #4

## 2024-03-14 MED FILL — ROSUVASTATIN 20 MG TABLET: ORAL | 90 days supply | Qty: 90 | Fill #1

## 2024-03-14 MED FILL — HYDROCHLOROTHIAZIDE 25 MG TABLET: ORAL | 90 days supply | Qty: 90 | Fill #1

## 2024-03-26 MED FILL — DOVATO 50 MG-300 MG TABLET: ORAL | 30 days supply | Qty: 30 | Fill #5

## 2024-04-26 MED FILL — DOVATO 50 MG-300 MG TABLET: ORAL | 30 days supply | Qty: 30 | Fill #6

## 2024-05-03 ENCOUNTER — Ambulatory Visit: Admit: 2024-05-03 | Discharge: 2024-05-04 | Payer: BLUE CROSS/BLUE SHIELD

## 2024-05-03 ENCOUNTER — Other Ambulatory Visit: Admit: 2024-05-03 | Discharge: 2024-05-04 | Payer: BLUE CROSS/BLUE SHIELD

## 2024-05-03 DIAGNOSIS — Z01818 Encounter for other preprocedural examination: Principal | ICD-10-CM

## 2024-05-03 DIAGNOSIS — C569 Malignant neoplasm of unspecified ovary: Principal | ICD-10-CM

## 2024-05-03 DIAGNOSIS — Z5111 Encounter for antineoplastic chemotherapy: Principal | ICD-10-CM

## 2024-05-15 ENCOUNTER — Other Ambulatory Visit: Payer: Self-pay | Admitting: Nurse Practitioner

## 2024-05-15 DIAGNOSIS — Z1231 Encounter for screening mammogram for malignant neoplasm of breast: Secondary | ICD-10-CM

## 2024-05-28 MED FILL — DOVATO 50 MG-300 MG TABLET: ORAL | 30 days supply | Qty: 30 | Fill #7

## 2024-05-28 MED FILL — METFORMIN 500 MG TABLET: ORAL | 90 days supply | Qty: 90 | Fill #2

## 2024-06-12 MED FILL — CETIRIZINE 10 MG TABLET: ORAL | 100 days supply | Qty: 100 | Fill #1

## 2024-06-12 MED FILL — BISACODYL 5 MG TABLET,DELAYED RELEASE: ORAL | 1 days supply | Qty: 4 | Fill #0

## 2024-06-12 MED FILL — CLEARLAX 17 GRAM/DOSE ORAL POWDER: ORAL | 1 days supply | Qty: 238 | Fill #0

## 2024-06-12 MED FILL — HYDROCHLOROTHIAZIDE 25 MG TABLET: ORAL | 90 days supply | Qty: 90 | Fill #2

## 2024-06-12 MED FILL — ROSUVASTATIN 20 MG TABLET: ORAL | 90 days supply | Qty: 90 | Fill #2

## 2024-06-12 MED FILL — FLUTICASONE PROPIONATE 50 MCG/ACTUATION NASAL SPRAY,SUSPENSION: NASAL | 30 days supply | Qty: 16 | Fill #1

## 2024-06-25 MED FILL — DOVATO 50 MG-300 MG TABLET: ORAL | 30 days supply | Qty: 30 | Fill #8

## 2024-07-23 MED FILL — DOVATO 50 MG-300 MG TABLET: ORAL | 30 days supply | Qty: 30 | Fill #9

## 2024-08-21 MED FILL — DOVATO 50 MG-300 MG TABLET: ORAL | 30 days supply | Qty: 30 | Fill #10

## 2024-08-23 ENCOUNTER — Ambulatory Visit: Admit: 2024-08-23 | Discharge: 2024-08-23 | Payer: BLUE CROSS/BLUE SHIELD | Attending: Family | Primary: Family

## 2024-08-23 DIAGNOSIS — Z01818 Encounter for other preprocedural examination: Principal | ICD-10-CM

## 2024-08-23 DIAGNOSIS — C569 Malignant neoplasm of unspecified ovary: Principal | ICD-10-CM

## 2024-08-23 DIAGNOSIS — R978 Other abnormal tumor markers: Principal | ICD-10-CM

## 2024-08-23 DIAGNOSIS — Z5111 Encounter for antineoplastic chemotherapy: Principal | ICD-10-CM

## 2024-08-23 DIAGNOSIS — K432 Incisional hernia without obstruction or gangrene: Principal | ICD-10-CM

## 2024-08-29 ENCOUNTER — Inpatient Hospital Stay: Admit: 2024-08-29 | Discharge: 2024-08-29 | Payer: BLUE CROSS/BLUE SHIELD

## 2024-09-02 DIAGNOSIS — Z5111 Encounter for antineoplastic chemotherapy: Principal | ICD-10-CM

## 2024-09-02 DIAGNOSIS — Z01818 Encounter for other preprocedural examination: Principal | ICD-10-CM

## 2024-09-02 DIAGNOSIS — C569 Malignant neoplasm of unspecified ovary: Principal | ICD-10-CM

## 2024-09-03 DIAGNOSIS — Z5111 Encounter for antineoplastic chemotherapy: Principal | ICD-10-CM

## 2024-09-03 DIAGNOSIS — C569 Malignant neoplasm of unspecified ovary: Principal | ICD-10-CM

## 2024-09-03 DIAGNOSIS — Z01818 Encounter for other preprocedural examination: Principal | ICD-10-CM

## 2024-09-03 DIAGNOSIS — K432 Incisional hernia without obstruction or gangrene: Principal | ICD-10-CM

## 2024-09-04 DIAGNOSIS — Z5111 Encounter for antineoplastic chemotherapy: Principal | ICD-10-CM

## 2024-09-04 DIAGNOSIS — Z01818 Encounter for other preprocedural examination: Principal | ICD-10-CM

## 2024-09-04 DIAGNOSIS — C569 Malignant neoplasm of unspecified ovary: Principal | ICD-10-CM

## 2024-09-06 DIAGNOSIS — Z01818 Encounter for other preprocedural examination: Principal | ICD-10-CM

## 2024-09-06 DIAGNOSIS — C569 Malignant neoplasm of unspecified ovary: Principal | ICD-10-CM

## 2024-09-06 DIAGNOSIS — Z5111 Encounter for antineoplastic chemotherapy: Principal | ICD-10-CM

## 2024-09-10 MED FILL — HYDROCHLOROTHIAZIDE 25 MG TABLET: 90 days supply | Qty: 90 | Fill #3

## 2024-09-10 MED FILL — METFORMIN 500 MG TABLET: 90 days supply | Qty: 90 | Fill #3

## 2024-09-10 MED FILL — ROSUVASTATIN 20 MG TABLET: 90 days supply | Qty: 90 | Fill #3

## 2024-09-24 DIAGNOSIS — C569 Malignant neoplasm of unspecified ovary: Secondary | ICD-10-CM

## 2024-09-24 DIAGNOSIS — Z01818 Encounter for other preprocedural examination: Secondary | ICD-10-CM

## 2024-09-24 DIAGNOSIS — B20 Human immunodeficiency virus [HIV] disease: Principal | ICD-10-CM

## 2024-09-24 DIAGNOSIS — Z5111 Encounter for antineoplastic chemotherapy: Principal | ICD-10-CM
